# Patient Record
Sex: Female | Born: 1950 | Race: White | Hispanic: No | Smoking: Never smoker
Health system: Southern US, Community
[De-identification: ages and names within clinical notes are randomized; demographics above are authoritative.]

## PROBLEM LIST (undated history)

## (undated) DIAGNOSIS — M1990 Unspecified osteoarthritis, unspecified site: Secondary | ICD-10-CM

## (undated) DIAGNOSIS — I10 Essential (primary) hypertension: Secondary | ICD-10-CM

## (undated) DIAGNOSIS — R0789 Other chest pain: Secondary | ICD-10-CM

## (undated) DIAGNOSIS — E119 Type 2 diabetes mellitus without complications: Secondary | ICD-10-CM

## (undated) DIAGNOSIS — R0600 Dyspnea, unspecified: Secondary | ICD-10-CM

## (undated) DIAGNOSIS — E785 Hyperlipidemia, unspecified: Secondary | ICD-10-CM

## (undated) DIAGNOSIS — R0609 Other forms of dyspnea: Secondary | ICD-10-CM

## (undated) DIAGNOSIS — T7840XA Allergy, unspecified, initial encounter: Secondary | ICD-10-CM

## (undated) DIAGNOSIS — H269 Unspecified cataract: Secondary | ICD-10-CM

## (undated) HISTORY — DX: Other forms of dyspnea: R06.09

## (undated) HISTORY — DX: Other chest pain: R07.89

## (undated) HISTORY — PX: PARTIAL HYSTERECTOMY: SHX80

## (undated) HISTORY — PX: KNEE SURGERY: SHX244

## (undated) HISTORY — DX: Unspecified cataract: H26.9

## (undated) HISTORY — DX: Allergy, unspecified, initial encounter: T78.40XA

## (undated) HISTORY — DX: Type 2 diabetes mellitus without complications: E11.9

## (undated) HISTORY — DX: Hyperlipidemia, unspecified: E78.5

## (undated) HISTORY — PX: CARPAL TUNNEL RELEASE: SHX101

## (undated) HISTORY — PX: OTHER SURGICAL HISTORY: SHX169

## (undated) HISTORY — DX: Unspecified osteoarthritis, unspecified site: M19.90

## (undated) HISTORY — DX: Dyspnea, unspecified: R06.00

## (undated) HISTORY — DX: Essential (primary) hypertension: I10

---

## 1972-07-12 HISTORY — PX: TUBAL LIGATION: SHX77

## 1984-07-12 HISTORY — PX: BACK SURGERY: SHX140

## 1999-05-26 ENCOUNTER — Ambulatory Visit (HOSPITAL_COMMUNITY): Admission: RE | Admit: 1999-05-26 | Discharge: 1999-05-26 | Payer: Self-pay | Admitting: Internal Medicine

## 1999-05-26 ENCOUNTER — Encounter: Payer: Self-pay | Admitting: Internal Medicine

## 2002-07-12 HISTORY — PX: BACK SURGERY: SHX140

## 2002-11-19 ENCOUNTER — Other Ambulatory Visit: Admission: RE | Admit: 2002-11-19 | Discharge: 2002-11-19 | Payer: Self-pay | Admitting: *Deleted

## 2003-01-09 ENCOUNTER — Encounter: Admission: RE | Admit: 2003-01-09 | Discharge: 2003-01-09 | Payer: Self-pay | Admitting: Orthopedic Surgery

## 2003-01-09 ENCOUNTER — Encounter: Payer: Self-pay | Admitting: Orthopedic Surgery

## 2003-02-12 ENCOUNTER — Encounter: Payer: Self-pay | Admitting: Orthopedic Surgery

## 2003-02-18 ENCOUNTER — Inpatient Hospital Stay (HOSPITAL_COMMUNITY): Admission: RE | Admit: 2003-02-18 | Discharge: 2003-02-20 | Payer: Self-pay | Admitting: Orthopedic Surgery

## 2003-02-18 ENCOUNTER — Encounter: Payer: Self-pay | Admitting: Orthopedic Surgery

## 2004-09-18 ENCOUNTER — Encounter: Admission: RE | Admit: 2004-09-18 | Discharge: 2004-09-18 | Payer: Self-pay | Admitting: Internal Medicine

## 2006-10-27 ENCOUNTER — Ambulatory Visit: Payer: Self-pay | Admitting: Gastroenterology

## 2013-01-26 ENCOUNTER — Other Ambulatory Visit: Payer: Self-pay | Admitting: *Deleted

## 2013-01-26 MED ORDER — INSULIN LISPRO 100 UNIT/ML (KWIKPEN): PEN_INJECTOR | SUBCUTANEOUS | Status: DC

## 2013-09-20 ENCOUNTER — Other Ambulatory Visit: Payer: Self-pay | Admitting: Family Medicine

## 2013-09-20 DIAGNOSIS — R0989 Other specified symptoms and signs involving the circulatory and respiratory systems: Secondary | ICD-10-CM

## 2013-09-24 ENCOUNTER — Other Ambulatory Visit: Payer: Self-pay | Admitting: Endocrinology

## 2013-09-25 ENCOUNTER — Ambulatory Visit
Admission: RE | Admit: 2013-09-25 | Discharge: 2013-09-25 | Disposition: A | Discharge status: Home / Self Care | Payer: Disability Insurance | Source: Ambulatory Visit | Attending: Family Medicine | Admitting: Family Medicine

## 2013-09-25 DIAGNOSIS — R0989 Other specified symptoms and signs involving the circulatory and respiratory systems: Secondary | ICD-10-CM

## 2014-04-11 ENCOUNTER — Ambulatory Visit (HOSPITAL_COMMUNITY)
Admission: RE | Admit: 2014-04-11 | Discharge: 2014-04-11 | Disposition: A | Discharge status: Home / Self Care | Payer: Disability Insurance | Source: Ambulatory Visit | Attending: Family Medicine | Admitting: Family Medicine

## 2014-04-11 ENCOUNTER — Other Ambulatory Visit (HOSPITAL_COMMUNITY): Payer: Self-pay | Admitting: Family Medicine

## 2014-04-11 DIAGNOSIS — M25512 Pain in left shoulder: Secondary | ICD-10-CM

## 2014-04-11 DIAGNOSIS — M545 Low back pain, unspecified: Secondary | ICD-10-CM

## 2014-04-11 DIAGNOSIS — I7090 Unspecified atherosclerosis: Secondary | ICD-10-CM | POA: Diagnosis not present

## 2014-04-11 DIAGNOSIS — M542 Cervicalgia: Secondary | ICD-10-CM

## 2014-04-11 DIAGNOSIS — M549 Dorsalgia, unspecified: Secondary | ICD-10-CM | POA: Diagnosis present

## 2014-04-11 DIAGNOSIS — M419 Scoliosis, unspecified: Secondary | ICD-10-CM | POA: Insufficient documentation

## 2016-11-01 DIAGNOSIS — M25511 Pain in right shoulder: Secondary | ICD-10-CM | POA: Diagnosis not present

## 2016-11-11 DIAGNOSIS — M25511 Pain in right shoulder: Secondary | ICD-10-CM | POA: Diagnosis not present

## 2016-11-22 DIAGNOSIS — S46011A Strain of muscle(s) and tendon(s) of the rotator cuff of right shoulder, initial encounter: Secondary | ICD-10-CM | POA: Diagnosis not present

## 2016-11-22 DIAGNOSIS — M25511 Pain in right shoulder: Secondary | ICD-10-CM | POA: Diagnosis not present

## 2016-11-30 DIAGNOSIS — M25511 Pain in right shoulder: Secondary | ICD-10-CM | POA: Diagnosis not present

## 2016-11-30 DIAGNOSIS — E781 Pure hyperglyceridemia: Secondary | ICD-10-CM | POA: Diagnosis not present

## 2016-11-30 DIAGNOSIS — D509 Iron deficiency anemia, unspecified: Secondary | ICD-10-CM | POA: Diagnosis not present

## 2016-11-30 DIAGNOSIS — R0789 Other chest pain: Secondary | ICD-10-CM | POA: Diagnosis not present

## 2016-11-30 DIAGNOSIS — Z7984 Long term (current) use of oral hypoglycemic drugs: Secondary | ICD-10-CM | POA: Diagnosis not present

## 2016-11-30 DIAGNOSIS — Z01818 Encounter for other preprocedural examination: Secondary | ICD-10-CM | POA: Diagnosis not present

## 2016-11-30 DIAGNOSIS — E1165 Type 2 diabetes mellitus with hyperglycemia: Secondary | ICD-10-CM | POA: Diagnosis not present

## 2016-11-30 DIAGNOSIS — I10 Essential (primary) hypertension: Secondary | ICD-10-CM | POA: Diagnosis not present

## 2016-11-30 DIAGNOSIS — R0609 Other forms of dyspnea: Secondary | ICD-10-CM | POA: Diagnosis not present

## 2016-11-30 DIAGNOSIS — E1140 Type 2 diabetes mellitus with diabetic neuropathy, unspecified: Secondary | ICD-10-CM | POA: Diagnosis not present

## 2016-11-30 DIAGNOSIS — Z794 Long term (current) use of insulin: Secondary | ICD-10-CM | POA: Diagnosis not present

## 2016-12-07 ENCOUNTER — Telehealth: Payer: Self-pay | Admitting: Student

## 2016-12-07 NOTE — Telephone Encounter (Signed)
Received records from [facility] for appointment on 12/17/16 with Bernerd Pho, [place].  Records put with Brittany's schedule for 12/17/16. lp

## 2016-12-10 DIAGNOSIS — Z794 Long term (current) use of insulin: Secondary | ICD-10-CM | POA: Diagnosis not present

## 2016-12-10 DIAGNOSIS — E781 Pure hyperglyceridemia: Secondary | ICD-10-CM | POA: Diagnosis not present

## 2016-12-10 DIAGNOSIS — I10 Essential (primary) hypertension: Secondary | ICD-10-CM | POA: Diagnosis not present

## 2016-12-10 DIAGNOSIS — Z7984 Long term (current) use of oral hypoglycemic drugs: Secondary | ICD-10-CM | POA: Diagnosis not present

## 2016-12-10 DIAGNOSIS — D72829 Elevated white blood cell count, unspecified: Secondary | ICD-10-CM | POA: Diagnosis not present

## 2016-12-10 DIAGNOSIS — R0989 Other specified symptoms and signs involving the circulatory and respiratory systems: Secondary | ICD-10-CM | POA: Diagnosis not present

## 2016-12-10 DIAGNOSIS — M25511 Pain in right shoulder: Secondary | ICD-10-CM | POA: Diagnosis not present

## 2016-12-10 DIAGNOSIS — Z01818 Encounter for other preprocedural examination: Secondary | ICD-10-CM | POA: Diagnosis not present

## 2016-12-10 DIAGNOSIS — E1140 Type 2 diabetes mellitus with diabetic neuropathy, unspecified: Secondary | ICD-10-CM | POA: Diagnosis not present

## 2016-12-10 DIAGNOSIS — E1165 Type 2 diabetes mellitus with hyperglycemia: Secondary | ICD-10-CM | POA: Diagnosis not present

## 2016-12-10 DIAGNOSIS — D509 Iron deficiency anemia, unspecified: Secondary | ICD-10-CM | POA: Diagnosis not present

## 2016-12-16 NOTE — Progress Notes (Signed)
Cardiology Office Note    Date:  12/17/2016   ID:  Yesenia Meyers, Yesenia Meyers, Yesenia Meyers, MRN [id number]  PCP:  Leighton Ruff, MD  Cardiologist: New to [facility] - Wishes to follow with Dr. Martinique   Chief Complaint  Patient presents with  . New Patient (Initial Visit)    Cardiac Clearance    History of Present Illness:    Yesenia Meyers is a 66 y.o. female with past medical history of HTN, HLD and Type 2 DM who is being evaluated today for preoperative cardiac clearance at the request of Dr. Leighton Ruff.   She was examined by her PCP on 11/30/2016 for preoperative clearance in regards to right rotator cuff repair surgery. She reported episodes of chest discomfort occurring with emotional stress but usually resolving with deep breathing. Also reported experiencing dyspnea on exertion when carrying out daily activities. EKG showed NSR, HR 84, LAD, and no acute ST or T-wave changes. Cardiac clearance was recommended prior to proceeding with surgery.  In talking with the patient today, she denies any prior cardiac history of known CAD or prior MI's. She does report having a stress test 10+ years ago which was normal. She has HTN, HLD, and Type 2 DM as mentioned above. Has a significant family history of CAD with her mother and father both having MI's in their 55s.  She denies any recent chest discomfort or dyspnea on exertion. She reports getting fatigued when walking around the grocery store due to her significant knee and back pain. She has 4 steps in her home and can climb these without difficulty. Says she has climbed a set of 10 stairs within the past year without any exertional symptoms. She does note occasional episodes of a tightness in her chest which occurs when she is anxious. The symptoms typically resolve within a few seconds and there is no association with exertion.   No recent orthopnea, PND, or lower extremity edema. She denies any palpitations or presyncope.  No prior tobacco  use, alcohol use, or recreational drug use.   Past Medical History:  Diagnosis Date  . Atypical chest pain   . Diabetes mellitus without complication ([place])   . DOE (dyspnea on exertion)   . Hyperlipemia   . Hypertension     History reviewed. No pertinent surgical history.  Current Medications: Outpatient Medications Prior to Visit  Medication Sig Dispense Refill  . acetaminophen (TYLENOL) 500 MG tablet Take 500 mg by mouth every 6 (six) hours as needed.    Marland Kitchen atorvastatin (LIPITOR) 20 MG tablet Take 20 mg by mouth daily.    . Biotin 10000 MCG TABS Take 1,000 Units by mouth daily.    . Insulin Detemir (LEVEMIR FLEXPEN) 100 UNIT/ML Pen Inject 24-30 Units into the skin daily at 10 pm. 24 units in the a.m. And 30 unites in the p.m    . losartan-hydrochlorothiazide (HYZAAR) 100-12.5 MG tablet Take 1 tablet by mouth daily.    . metFORMIN (GLUCOPHAGE) 500 MG tablet Take 500 mg by mouth 2 (two) times daily with a meal.    . Multiple Vitamin (MULTI-VITAMIN DAILY PO) Take 1 tablet by mouth daily.    . Omega-3 Fatty Acids (FISH OIL) 1000 MG CAPS Take 1,000 mg by mouth daily.    . insulin lispro (HUMALOG KWIKPEN) 100 UNIT/ML SOPN Take 14 units before breakfast and lunch and 16 units before supper daily 5 pen 6   No facility-administered medications prior to visit.  Allergies:   Celebrex [celecoxib]; Feldene [piroxicam]; and Voltaren [diclofenac sodium]   Social History   Social History  . Marital status: Divorced    Spouse name: N/A  . Number of children: N/A  . Years of education: N/A   Social History Main Topics  . Smoking status: Never Smoker  . Smokeless tobacco: Never Used  . Alcohol use No  . Drug use: No  . Sexual activity: Not Asked   Other Topics Concern  . None   Social History Narrative  . None     Family History:  The patient's family history includes Diabetes in her father and mother; Heart disease in her father and mother; Hypertension in her father and  mother.   Review of Systems:   Please see the history of present illness.     General:  No chills, fever, night sweats or weight changes. Positive for fatigue.  Cardiovascular:  No chest pain, dyspnea on exertion, edema, orthopnea, palpitations, paroxysmal nocturnal dyspnea. Dermatological: No rash, lesions/masses Respiratory: No cough, dyspnea Urologic: No hematuria, dysuria MSK: Positive for right shoulder pain and knee pain.  Abdominal:   No nausea, vomiting, diarrhea, bright red blood per rectum, melena, or hematemesis Neurologic:  No visual changes, wkns, changes in mental status. All other systems reviewed and are otherwise negative except as noted above.   Physical Exam:    VS:  BP 134/80   Pulse 78   Ht 5' (1.524 m)   Wt 174 lb (78.9 kg)   BMI 33.98 kg/m    General: Well developed, well nourished Caucasian female appearing in no acute distress. Head: Normocephalic, atraumatic, sclera non-icteric, no xanthomas, nares are without discharge.  Neck: No carotid bruits. JVD not elevated.  Lungs: Respirations regular and unlabored, without wheezes or rales.  Heart: Regular rate and rhythm. No S3 or S4.  No murmur, no rubs, or gallops appreciated. Abdomen: Soft, non-tender, non-distended with normoactive bowel sounds. No hepatomegaly. No rebound/guarding. No obvious abdominal masses. Msk:  Strength and tone appear normal for age. No joint deformities or effusions. Extremities: No clubbing or cyanosis. No lower extremity edema.  Distal pedal pulses are 2+ bilaterally. Neuro: Alert and oriented X 3. Moves all extremities spontaneously. No focal deficits noted. Psych:  Responds to questions appropriately with a normal affect. Skin: No rashes or lesions noted  Wt Readings from Last 3 Encounters:  12/17/16 174 lb (78.9 kg)     Studies/Labs Reviewed:   EKG:  EKG is not ordered today. EKG from 11/30/2016 is reviewed which shows NSR, HR 84, with LAD. No acute ST or T-wave changes.    Recent Labs: No results found for requested labs within last 8760 hours.   Lipid Panel No results found for: CHOL, TRIG, HDL, CHOLHDL, VLDL, LDLCALC, LDLDIRECT   Assessment:    1. Preoperative cardiovascular examination   2. Essential hypertension   3. Mixed hyperlipidemia   4. IDDM (insulin dependent diabetes mellitus) ([place])      Plan:   In order of problems listed above:  1. Preoperative Cardiac Clearance - the patient is planning to undergo rotator cuff repair. She has a known history of HTN, HLD and Type 2 DM but denies any known CAD or prior MI's. - she is not overly active at baseline secondary to back and knee pain but denies any exertional chest pain or dyspnea on exertion. Has experienced episodes of chest tightness when under financial stress which improves with deep breathing.  - EKG shows no acute ischemic  changes.  - overall, her procedure itself is low-risk from a cardiac perspective and she denies any recent anginal symptoms. Therefore no further testing is indicated prior to her surgery and she is cleared to proceed. Would recommend an ETT to be obtained after surgery for risk stratification in the setting of her multiple risk-factors but this does not need to be obtained prior to her procedure. Discussed the patient with Dr. Ellyn Hack (DOD) as she is new to our practice and he was in agreement with the above assessment and plan.   2. HTN - BP is well-controlled at 134/80 during today's visit.  - continue Losartan-HCTZ 100-12.5mg  daily.   3. HLD - Lipid Panel in 11/2016 showed total cholesterol of 120, HDL 34, and LDL of 38.  - continue Lipitor 20mg  daily.   4. IDDM - Hgb A1c elevated to 8.8 when checked on 11/30/2016. - followed by PCP.   Medication Adjustments/Labs and Tests Ordered: Current medicines are reviewed at length with the patient today.  Concerns regarding medicines are outlined above.  Medication changes, Labs and Tests ordered today are listed in  the Patient Instructions below. Patient Instructions  Medication Instructions: No changes   Procedures/Testing: Your physician has requested that you have an exercise tolerance test. For further information please visit [email or web address]. Please also follow instruction sheet, as given. This will take place at [place], [address].  Please have this done after you have been cleared by the surgeon.   Follow-Up: Your physician wants you to follow-up in: One year with Dr. Martinique. You will receive a reminder letter in the mail two months in advance. If you don't receive a letter, please call our office to schedule the follow-up appointment.   Signed, Erma Heritage, PA-C  12/17/2016 2:11 PM    [facility] [address], [place], [place]  [address] Phone: [phone]; Fax: [phone]  [address] [place], [place] [address] Phone: [phone]

## 2016-12-17 ENCOUNTER — Ambulatory Visit (INDEPENDENT_AMBULATORY_CARE_PROVIDER_SITE_OTHER): Payer: PPO | Admitting: Student

## 2016-12-17 ENCOUNTER — Encounter: Payer: Self-pay | Admitting: Student

## 2016-12-17 VITALS — BP 134/80 | HR 78 | Ht 60.0 in | Wt 174.0 lb

## 2016-12-17 DIAGNOSIS — E782 Mixed hyperlipidemia: Secondary | ICD-10-CM

## 2016-12-17 DIAGNOSIS — Z794 Long term (current) use of insulin: Secondary | ICD-10-CM

## 2016-12-17 DIAGNOSIS — Z01810 Encounter for preprocedural cardiovascular examination: Secondary | ICD-10-CM

## 2016-12-17 DIAGNOSIS — I10 Essential (primary) hypertension: Secondary | ICD-10-CM

## 2016-12-17 DIAGNOSIS — E119 Type 2 diabetes mellitus without complications: Secondary | ICD-10-CM

## 2016-12-17 DIAGNOSIS — IMO0001 Reserved for inherently not codable concepts without codable children: Secondary | ICD-10-CM

## 2016-12-17 NOTE — Patient Instructions (Addendum)
Medication Instructions: No changes   Procedures/Testing: Your physician has requested that you have an exercise tolerance test. For further information please visit [email or web address]. Please also follow instruction sheet, as given. This will take place at [place], [address].  Please have this done after you have been cleared by the surgeon.   Follow-Up: Your physician wants you to follow-up in: One year with Dr. Martinique. You will receive a reminder letter in the mail two months in advance. If you don't receive a letter, please call our office to schedule the follow-up appointment.   If you need a refill on your cardiac medications before your next appointment, please call your pharmacy.   Exercise Stress Electrocardiogram An exercise stress electrocardiogram is a test that is done to evaluate the blood supply to your heart. This test may also be called exercise stress electrocardiography. The test is done while you are walking on a treadmill. The goal of this test is to raise your heart rate. This test is done to find areas of poor blood flow to the heart by determining the extent of coronary artery disease (CAD). CAD is defined as narrowing in one or more heart (coronary) arteries of more than 70%. If you have an abnormal test result, this may mean that you are not getting adequate blood flow to your heart during exercise. Additional testing may be needed to understand why your test was abnormal. Tell a health care provider about:  Any allergies you have.  All medicines you are taking, including vitamins, herbs, eye drops, creams, and over-the-counter medicines.  Any problems you or family members have had with anesthetic medicines.  Any blood disorders you have.  Any surgeries you have had.  Any medical conditions you have.  Possibility of pregnancy, if this applies. What are the risks? Generally, this is a safe procedure. However, as with any procedure, complications  can occur. Possible complications can include:  Pain or pressure in the following areas: ? Chest. ? Jaw or neck. ? Between your shoulder blades. ? Radiating down your left arm.  Dizziness or light-headedness.  Shortness of breath.  Increased or irregular heartbeats.  Nausea or vomiting.  Heart attack (rare).  What happens before the procedure?  Avoid all forms of caffeine 24 hours before your test or as directed by your health care provider. This includes coffee, tea (even decaffeinated tea), caffeinated sodas, chocolate, cocoa, and certain pain medicines.  Follow your health care provider's instructions regarding eating and drinking before the test.  Take your medicines as directed at regular times with water unless instructed otherwise. Exceptions may include: ? If you have diabetes, ask how you are to take your insulin or pills. It is common to adjust insulin dosing the morning of the test. ? If you are taking beta-blocker medicines, it is important to talk to your health care provider about these medicines well before the date of your test. Taking beta-blocker medicines may interfere with the test. In some cases, these medicines need to be changed or stopped 24 hours or more before the test. ? If you wear a nitroglycerin patch, it may need to be removed prior to the test. Ask your health care provider if the patch should be removed before the test.  If you use an inhaler for any breathing condition, bring it with you to the test.  If you are an outpatient, bring a snack so you can eat right after the stress phase of the test.  Do not smoke  for 4 hours prior to the test or as directed by your health care provider.  Do not apply lotions, powders, creams, or oils on your chest prior to the test.  Wear loose-fitting clothes and comfortable shoes for the test. This test involves walking on a treadmill. What happens during the procedure?  Multiple patches (electrodes) will be  put on your chest. If needed, small areas of your chest may have to be shaved to get better contact with the electrodes. Once the electrodes are attached to your body, multiple wires will be attached to the electrodes and your heart rate will be monitored.  Your heart will be monitored both at rest and while exercising.  You will walk on a treadmill. The treadmill will be started at a slow pace. The treadmill speed and incline will gradually be increased to raise your heart rate. What happens after the procedure?  Your heart rate and blood pressure will be monitored after the test.  You may return to your normal schedule including diet, activities, and medicines, unless your health care provider tells you otherwise. This information is not intended to replace advice given to you by your health care provider. Make sure you discuss any questions you have with your health care provider. Document Released: 06/25/2000 Document Revised: 12/04/2015 Document Reviewed: 03/05/2013 Elsevier Interactive Patient Education  2017 [employer].

## 2016-12-21 ENCOUNTER — Encounter: Payer: Self-pay | Admitting: Hematology

## 2016-12-21 ENCOUNTER — Telehealth: Payer: Self-pay | Admitting: Hematology

## 2016-12-21 NOTE — Telephone Encounter (Signed)
Appt has been scheduled for the pt to see Dr. Irene Limbo on 6/28 at 3pm. Pt agreed to the appt date and time. Demographics verified. Letter mailed to the pt and faxed to the referring.

## 2016-12-22 ENCOUNTER — Telehealth (HOSPITAL_COMMUNITY): Payer: Self-pay | Admitting: Student

## 2016-12-23 NOTE — Telephone Encounter (Signed)
  12/22/2016 10:01 AM Phone (Outgoing) Yesenia Meyers, Yesenia Meyers (Self) [phone] (H)   Left Message - Called pt and lmsg for him to CB to get scheduled for ETT.    By Verdene Rio

## 2016-12-27 ENCOUNTER — Other Ambulatory Visit: Payer: Self-pay | Admitting: *Deleted

## 2016-12-27 ENCOUNTER — Encounter: Payer: Self-pay | Admitting: *Deleted

## 2016-12-27 NOTE — Patient Outreach (Signed)
HTA THN Screen:  Pt sees primary care MD routinely (Dr. Abel Presto patient is scheduled for orthopedic surgery on her shoulder. She had a fall last October which injured it. She takes medications as ordered for chronic problems (DM, HTN, Hyperlipidemia). She admits her diabetes management could be better. Pt No acute health concerns. No care management needs. Introductory letter sent and several diabetes diet Emmi education articles.  I have asked the pt to call me after her surgery should she have any needs.  Yesenia Meyers. Yesenia Neither, MSN, [facility] Gerontological Nurse Practitioner [facility] Care Management [phone]

## 2016-12-29 DIAGNOSIS — D472 Monoclonal gammopathy: Secondary | ICD-10-CM | POA: Diagnosis not present

## 2016-12-29 DIAGNOSIS — D72829 Elevated white blood cell count, unspecified: Secondary | ICD-10-CM | POA: Diagnosis not present

## 2016-12-29 DIAGNOSIS — D72820 Lymphocytosis (symptomatic): Secondary | ICD-10-CM | POA: Diagnosis not present

## 2016-12-29 DIAGNOSIS — D729 Disorder of white blood cells, unspecified: Secondary | ICD-10-CM | POA: Diagnosis not present

## 2017-01-06 ENCOUNTER — Encounter: Payer: Self-pay | Admitting: Hematology

## 2017-01-06 ENCOUNTER — Ambulatory Visit (HOSPITAL_BASED_OUTPATIENT_CLINIC_OR_DEPARTMENT_OTHER): Payer: PPO

## 2017-01-06 ENCOUNTER — Telehealth: Payer: Self-pay | Admitting: Hematology

## 2017-01-06 ENCOUNTER — Ambulatory Visit (HOSPITAL_BASED_OUTPATIENT_CLINIC_OR_DEPARTMENT_OTHER): Payer: PPO | Admitting: Hematology

## 2017-01-06 ENCOUNTER — Other Ambulatory Visit (HOSPITAL_COMMUNITY)
Admission: RE | Admit: 2017-01-06 | Discharge: 2017-01-06 | Disposition: A | Discharge status: Home / Self Care | Payer: PPO | Source: Ambulatory Visit | Attending: Hematology | Admitting: Hematology

## 2017-01-06 VITALS — BP 152/74 | HR 105 | Temp 98.4°F | Resp 18 | Ht 60.0 in | Wt 173.9 lb

## 2017-01-06 DIAGNOSIS — D72820 Lymphocytosis (symptomatic): Secondary | ICD-10-CM | POA: Diagnosis not present

## 2017-01-06 DIAGNOSIS — D729 Disorder of white blood cells, unspecified: Secondary | ICD-10-CM

## 2017-01-06 DIAGNOSIS — D472 Monoclonal gammopathy: Secondary | ICD-10-CM | POA: Diagnosis not present

## 2017-01-06 DIAGNOSIS — D72829 Elevated white blood cell count, unspecified: Secondary | ICD-10-CM

## 2017-01-06 LAB — COMPREHENSIVE METABOLIC PANEL
ALT: 31 U/L (ref 0–55)
AST: 27 U/L (ref 5–34)
Albumin: 4 g/dL (ref 3.5–5.0)
Alkaline Phosphatase: 81 U/L (ref 40–150)
Anion Gap: 11 mEq/L (ref 3–11)
BUN: 13.4 mg/dL (ref 7.0–26.0)
CO2: 28 meq/L (ref 22–29)
Calcium: 10.2 mg/dL (ref 8.4–10.4)
Chloride: 102 mEq/L (ref 98–109)
Creatinine: 1 mg/dL (ref 0.6–1.1)
EGFR: 56 mL/min/{1.73_m2} — ABNORMAL LOW (ref >90)
GLUCOSE: 144 mg/dL — AB (ref 70–140)
POTASSIUM: 3.7 meq/L (ref 3.5–5.1)
SODIUM: 142 meq/L (ref 136–145)
Total Bilirubin: 0.88 mg/dL (ref 0.20–1.20)
Total Protein: 8.2 g/dL (ref 6.4–8.3)

## 2017-01-06 LAB — CBC & DIFF AND RETIC
BASO%: 0.2 % (ref 0.0–2.0)
Basophils Absolute: 0 10*3/uL (ref 0.0–0.1)
EOS%: 1.4 % (ref 0.0–7.0)
Eosinophils Absolute: 0.2 10*3/uL (ref 0.0–0.5)
HCT: 44.6 % (ref 34.8–46.6)
HGB: 15.1 g/dL (ref 11.6–15.9)
Immature Retic Fract: 6.3 % (ref 1.60–10.00)
LYMPH#: 4.1 10*3/uL — AB (ref 0.9–3.3)
LYMPH%: 28.1 % (ref 14.0–49.7)
MCH: 30.7 pg (ref 25.1–34.0)
MCHC: 33.9 g/dL (ref 31.5–36.0)
MCV: 90.7 fL (ref 79.5–101.0)
MONO#: 1.2 10*3/uL — ABNORMAL HIGH (ref 0.1–0.9)
MONO%: 8.4 % (ref 0.0–14.0)
NEUT%: 61.9 % (ref 38.4–76.8)
NEUTROS ABS: 9 10*3/uL — AB (ref 1.5–6.5)
Platelets: 298 10*3/uL (ref 145–400)
RBC: 4.92 10*6/uL (ref 3.70–5.45)
RDW: 12.6 % (ref 11.2–14.5)
RETIC %: 1.45 % (ref 0.70–2.10)
RETIC CT ABS: 71.34 10*3/uL (ref 33.70–90.70)
WBC: 14.6 10*3/uL — AB (ref 3.9–10.3)

## 2017-01-06 LAB — LACTATE DEHYDROGENASE: LDH: 178 U/L (ref 125–245)

## 2017-01-06 NOTE — Patient Instructions (Signed)
Thank you for choosing [facility] to provide your oncology and hematology care.  To afford each patient quality time with our providers, please arrive 30 minutes before your scheduled appointment time.  If you arrive late for your appointment, you may be asked to reschedule.  We strive to give you quality time with our providers, and arriving late affects you and other patients whose appointments are after yours.  If you are a no show for multiple scheduled visits, you may be dismissed from the clinic at the providers discretion.   Again, thank you for choosing [facility], our hope is that these requests will decrease the amount of time that you wait before being seen by our physicians.  ______________________________________________________________________ Should you have questions after your visit to the  Cancer Center, please contact our office at [phone] between the hours of 8:30 and 4:30 p.m.    Voicemails left after 4:30p.m will not be returned until the following business day.   For prescription refill requests, please have your pharmacy contact us directly.  Please also try to allow 48 hours for prescription requests.   Please contact the scheduling department for questions regarding scheduling.  For scheduling of procedures such as PET scans, CT scans, MRI, Ultrasound, etc please contact central scheduling at [phone].   Resources For Cancer Patients and Caregivers:  American Cancer Society:  800-227-2345  Can help patients locate various types of support and financial assistance Cancer Care: 1-800-813-HOPE (4673) Provides financial assistance, online support groups, medication/co-pay assistance.   [place] DSS:  336-641-3447 Where to apply for food stamps, Medicaid, and utility assistance Medicare Rights Center: 800-333-4114 Helps people with Medicare understand their rights and benefits, navigate the Medicare system, and secure the  quality healthcare they deserve SCAT: 336-333-6589 [employer] [employer]'s shared-ride transportation service for eligible riders who have a disability that prevents them from riding the fixed route bus.   For additional information on assistance programs please contact our social worker:   Grier Hock/Abigail Elmore:  [phone] 

## 2017-01-06 NOTE — Progress Notes (Signed)
HEMATOLOGY/ONCOLOGY CONSULTATION NOTE  Date of Service: 01/06/2017  Patient Care Team: Leighton Ruff, MD as PCP - General (Family Medicine)  CHIEF COMPLAINTS/PURPOSE OF CONSULTATION:  Abnormal CBC/leucocytosis.   HISTORY OF PRESENTING ILLNESS:   Yesenia Meyers is a wonderful 66 y.o. female who has been referred to [place] by Dr ..Leighton Ruff, MD  for evaluation of leucocytosis.  Patient has a h/o DM2, IDA, HTN and notes severe rt shoulder pain and decreased ROM since Oct 2017 when she had a fall with trauma to the rt shoulder. She notes that she has been scheduled for surgery soon and is very keen to proceed with surgery.  Her CBC done with her PCP show mild leucocytosis of 12.4-14k from atleast 03/09/2016 with predominantly neutrophilia with ANC 7.7-8.9k. With borderline stable lymphocytosis with lymph # of around 3.8k and borderline monocytosis 1-1.1k.  Her hgb/hct and platelet counts are WNL.  She currently notes rt shoulder pain. No fevers/chills/night sweats/unexplained weight loss/enlarged LN or other overt new focal symptoms.  She notes that she did have a bad dental/gum infection and required antibiotics. No symptoms suggestive of another overt source of infection Denies being on steroids recently.   MEDICAL HISTORY:  Past Medical History:  Diagnosis Date  . Atypical chest pain    Related to anxiety   . Diabetes mellitus without complication ([place])   . DOE (dyspnea on exertion)   . Hyperlipemia   . Hypertension   iron deficiency Anemia  SURGICAL HISTORY: Past Surgical History:  Procedure Laterality Date  . BACK SURGERY  1986   Crushed 2 discs   . BACK SURGERY  2004   Spinal stenosis  . CARPAL TUNNEL RELEASE Bilateral   . KNEE SURGERY Left    66 y.o.  . PARTIAL HYSTERECTOMY N/A    Age 19  . TUBAL LIGATION  1974    SOCIAL HISTORY: Social History   Social History  . Marital status: Divorced    Spouse name: N/A  . Number of children: N/A    . Years of education: N/A   Occupational History  . Not on file.   Social History Main Topics  . Smoking status: Never Smoker  . Smokeless tobacco: Never Used  . Alcohol use No  . Drug use: No  . Sexual activity: Yes   Other Topics Concern  . Not on file   Social History Narrative  . No narrative on file    FAMILY HISTORY: Family History  Problem Relation Age of Onset  . Heart disease Mother        72's  . Diabetes Mother   . Hypertension Mother   . Heart disease Father        60's  . Diabetes Father   . Hypertension Father   . Diabetes Sister   . Diabetes Brother   . Cancer Brother        Colon  . Diabetes Brother     ALLERGIES:  is allergic to celebrex [celecoxib]; feldene [piroxicam]; and voltaren [diclofenac sodium].  MEDICATIONS:  Current Outpatient Prescriptions  Medication Sig Dispense Refill  . acetaminophen (TYLENOL) 500 MG tablet Take 500 mg by mouth every 6 (six) hours as needed (Pt does not take unless severe pain.).     Marland Kitchen atorvastatin (LIPITOR) 20 MG tablet Take 20 mg by mouth daily.    . Biotin 10000 MCG TABS Take 1,000 Units by mouth daily.    . Insulin Detemir (LEVEMIR FLEXPEN) 100 UNIT/ML Pen Inject 24-30 Units  into the skin daily at 10 pm. 24 units in the a.m. And 30 unites in the p.m    . losartan-hydrochlorothiazide (HYZAAR) 100-12.5 MG tablet Take 1 tablet by mouth daily.    . metFORMIN (GLUCOPHAGE) 500 MG tablet Take 500 mg by mouth 2 (two) times daily with a meal.    . Multiple Vitamin (MULTI-VITAMIN DAILY PO) Take 1 tablet by mouth daily.    . Omega-3 Fatty Acids (FISH OIL) 1000 MG CAPS Take 1,000 mg by mouth daily.     No current facility-administered medications for this visit.     REVIEW OF SYSTEMS:    10 Point review of Systems was done is negative except as noted above.  PHYSICAL EXAMINATION: ECOG PERFORMANCE STATUS: 1 - Symptomatic but completely ambulatory  . Vitals:   01/06/17 1456  BP: (!) 152/74  Pulse: (!) 105   Resp: 18  Temp: 98.4 F (36.9 C)   Filed Weights   01/06/17 1456  Weight: 173 lb 14.4 oz (78.9 kg)   .Body mass index is 33.96 kg/m.  GENERAL:alert, in no acute distress and comfortable SKIN: no acute rashes, no significant lesions EYES: conjunctiva are pink and non-injected, sclera anicteric OROPHARYNX: MMM, no exudates, no oropharyngeal erythema or ulceration NECK: supple, no JVD LYMPH:  no palpable lymphadenopathy in the cervical, axillary or inguinal regions LUNGS: clear to auscultation b/l with normal respiratory effort HEART: regular rate & rhythm ABDOMEN:  normoactive bowel sounds , non tender, not distended. No palpable hepato-splenoemgaly. Extremity: no pedal edema PSYCH: alert & oriented x 3 with fluent speech NEURO: no focal motor/sensory deficits  LABORATORY DATA:  I have reviewed the data as listed  . CBC Latest Ref Rng & Units 01/06/2017  WBC 3.9 - 10.3 10e3/uL 14.6(H)  Hemoglobin 11.6 - 15.9 g/dL 15.1  Hematocrit 34.8 - 46.6 % 44.6  Platelets 145 - 400 10e3/uL 298   . CBC    Component Value Date/Time   WBC 14.6 (H) 01/06/2017 1611   RBC 4.92 01/06/2017 1611   HGB 15.1 01/06/2017 1611   HCT 44.6 01/06/2017 1611   PLT 298 01/06/2017 1611   MCV 90.7 01/06/2017 1611   MCH 30.7 01/06/2017 1611   MCHC 33.9 01/06/2017 1611   RDW 12.6 01/06/2017 1611   LYMPHSABS 4.1 (H) 01/06/2017 1611   MONOABS 1.2 (H) 01/06/2017 1611   EOSABS 0.2 01/06/2017 1611   BASOSABS 0.0 01/06/2017 1611   ANC 9k . CMP Latest Ref Rng & Units 01/06/2017 01/06/2017  Glucose 70 - 140 mg/dl 144(H) -  BUN 7.0 - 26.0 mg/dL 13.4 -  Creatinine 0.6 - 1.1 mg/dL 1.0 -  Sodium 136 - 145 mEq/L 142 -  Potassium 3.5 - 5.1 mEq/L 3.7 -  CO2 22 - 29 mEq/L 28 -  Calcium 8.4 - 10.4 mg/dL 10.2 -  Total Protein 6.0 - 8.5 g/dL 8.2 7.8  Total Bilirubin 0.20 - 1.20 mg/dL 0.88 -  Alkaline Phos 40 - 150 U/L 81 -  AST 5 - 34 U/L 27 -  ALT 0 - 55 U/L 31 -   Component     Latest Ref Rng & Units  01/06/2017  LDH     125 - 245 U/L 178  Sed Rate     0 - 40 mm/hr 8        RADIOGRAPHIC STUDIES: I have personally reviewed the radiological images as listed and agreed with the findings in the report. No results found.  ASSESSMENT & PLAN:   66 yo  with incidentally noted leucocytosis on labs  1) Chronic persistent Neutrophilia  Present since at least 02/2016. No significant blasts or left shift on peripheral blood smear.  LDH WNL Sed rate WNL Appears likely reactive ? Uncontrolled rt shoulder pain. R/o inflammation in rt shoulder. Did have issues with dental /periodontal infection in march 2018. No other overt source of infection No palpable hepato-splenomegaly or LNadenopathy  2) Lymphocytosis - non clonal on flow cytometry 3) Mild monocytosis.  4) Newly noted monoclonal IgA Kappa Only small amount of M spike of 0.4g/dl with normal LDH , sed rate and no associated overt CRAB criteria. Likely MGUS.  PLAN -no overt hematologic contraindication to proceed with planned Rt shoulder surgery -workup done for leucocytosis as highlighted/detailed above . -less likely Myelo or lymphoproliferative process -whole body skeletal survey in 2-3 weeks -will hold off on sending clonal molecular markers for MPN at this time unless significant increase in WBC counts. Might improve after shoulder surgery if significant inflammation/infection controlled in this area. 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 -continue f/u with PCP for age appropriate cancer screening.  . Orders Placed This Encounter  Procedures  . CBC & Diff and Retic    Standing Status:   Future    Number of Occurrences:   1    Standing Expiration Date:   01/06/2018  . Comprehensive metabolic panel    Standing Status:   Future    Number of Occurrences:   1    Standing Expiration Date:   01/06/2018  . Lactate dehydrogenase    Standing Status:   Future    Number of Occurrences:   1    Standing  Expiration Date:   01/06/2018  . Sedimentation rate    Standing Status:   Future    Number of Occurrences:   1    Standing Expiration Date:   01/06/2018  . Flow Cytometry    Lymphocytosis r/o clonal process    Standing Status:   Future    Number of Occurrences:   1    Standing Expiration Date:   01/06/2018  . Multiple Myeloma Panel (SPEP&IFE w/QIG)    Standing Status:   Future    Number of Occurrences:   1    Standing Expiration Date:   01/06/2018  . Kappa/lambda light chains    Standing Status:   Future    Number of Occurrences:   1    Standing Expiration Date:   02/10/2018    Labs today RTC with Dr Irene Limbo in 4 weeks with skeletal survey  All of the patients questions were answered with apparent satisfaction. The patient knows to call the clinic with any problems, questions or concerns.  I spent 40 minutes counseling the patient face to face. The total time spent in the appointment was 60 minutes and more than 50% was on counseling and direct patient cares.    Sullivan Lone MD [place] AAHIVMS [facility] Hematology/Oncology Physician [facility]  (Office):       [phone] (Work cell):  [phone] (Fax):           [phone]  01/06/2017 3:32 PM

## 2017-01-06 NOTE — Telephone Encounter (Signed)
Scheduled appt per 6/28 los - Gave patient AVS  And calender per los.

## 2017-01-07 DIAGNOSIS — D72829 Elevated white blood cell count, unspecified: Secondary | ICD-10-CM | POA: Diagnosis not present

## 2017-01-07 LAB — KAPPA/LAMBDA LIGHT CHAINS
IG KAPPA FREE LIGHT CHAIN: 48.1 mg/L — AB (ref 3.3–19.4)
Ig Lambda Free Light Chain: 19.1 mg/L (ref 5.7–26.3)
Kappa/Lambda FluidC Ratio: 2.52 — ABNORMAL HIGH (ref 0.26–1.65)

## 2017-01-07 LAB — SEDIMENTATION RATE: SED RATE: 8 mm/h (ref 0–40)

## 2017-01-10 LAB — MULTIPLE MYELOMA PANEL, SERUM
Albumin SerPl Elph-Mcnc: 4 g/dL (ref 2.9–4.4)
Albumin/Glob SerPl: 1.1 (ref 0.7–1.7)
Alpha 1: 0.3 g/dL (ref 0.0–0.4)
Alpha2 Glob SerPl Elph-Mcnc: 1 g/dL (ref 0.4–1.0)
B-Globulin SerPl Elph-Mcnc: 1.7 g/dL — ABNORMAL HIGH (ref 0.7–1.3)
Gamma Glob SerPl Elph-Mcnc: 0.8 g/dL (ref 0.4–1.8)
Globulin, Total: 3.8 g/dL (ref 2.2–3.9)
IgA, Qn, Serum: 739 mg/dL — ABNORMAL HIGH (ref 87–352)
IgG, Qn, Serum: 757 mg/dL (ref 700–1600)
IgM, Qn, Serum: 118 mg/dL (ref 26–217)
M Protein SerPl Elph-Mcnc: 0.4 g/dL — ABNORMAL HIGH
Total Protein: 7.8 g/dL (ref 6.0–8.5)

## 2017-01-10 LAB — FLOW CYTOMETRY

## 2017-01-11 DIAGNOSIS — M75111 Incomplete rotator cuff tear or rupture of right shoulder, not specified as traumatic: Secondary | ICD-10-CM | POA: Diagnosis not present

## 2017-01-11 DIAGNOSIS — G8918 Other acute postprocedural pain: Secondary | ICD-10-CM | POA: Diagnosis not present

## 2017-01-11 DIAGNOSIS — M7541 Impingement syndrome of right shoulder: Secondary | ICD-10-CM | POA: Diagnosis not present

## 2017-01-11 DIAGNOSIS — S43421A Sprain of right rotator cuff capsule, initial encounter: Secondary | ICD-10-CM | POA: Diagnosis not present

## 2017-01-18 DIAGNOSIS — Z4789 Encounter for other orthopedic aftercare: Secondary | ICD-10-CM | POA: Diagnosis not present

## 2017-01-18 DIAGNOSIS — S46011S Strain of muscle(s) and tendon(s) of the rotator cuff of right shoulder, sequela: Secondary | ICD-10-CM | POA: Diagnosis not present

## 2017-01-20 ENCOUNTER — Telehealth: Payer: Self-pay

## 2017-01-20 NOTE — Telephone Encounter (Signed)
Patient cleared for surgery per Dr. Julien Nordmann during Dr. Grier Mitts vacation. Recommendation to consult with PCP for cardiac and respiratory clearance. Confirmed fax receipt.

## 2017-01-28 DIAGNOSIS — Z4789 Encounter for other orthopedic aftercare: Secondary | ICD-10-CM | POA: Diagnosis not present

## 2017-01-28 DIAGNOSIS — S46011S Strain of muscle(s) and tendon(s) of the rotator cuff of right shoulder, sequela: Secondary | ICD-10-CM | POA: Diagnosis not present

## 2017-02-10 ENCOUNTER — Telehealth: Payer: Self-pay | Admitting: *Deleted

## 2017-02-10 NOTE — Telephone Encounter (Signed)
FYI "Call received earlier about an appointment Monday.  Also spoke with someone about a test or procedure to be done?  What is Monday's appointment about?"    Provider F/U only is scheduled 02-14-2017.  Arrive at 9:10am for 9:40 visit with provider.  Note order for bone survey not authorized or scheduled.  Suggested she discuss this with F/U visit.  No further questions.    "I'll come in.  Just getting over rotator cuff surgery, on pain medicines so my brain just needed to understand what's going on."

## 2017-02-14 ENCOUNTER — Telehealth: Payer: Self-pay | Admitting: Hematology

## 2017-02-14 ENCOUNTER — Encounter: Payer: Self-pay | Admitting: Hematology

## 2017-02-14 ENCOUNTER — Ambulatory Visit (HOSPITAL_BASED_OUTPATIENT_CLINIC_OR_DEPARTMENT_OTHER): Payer: PPO | Admitting: Hematology

## 2017-02-14 VITALS — BP 151/66 | HR 93 | Temp 98.7°F | Resp 18 | Ht 60.0 in | Wt 170.6 lb

## 2017-02-14 DIAGNOSIS — D472 Monoclonal gammopathy: Secondary | ICD-10-CM | POA: Diagnosis not present

## 2017-02-14 DIAGNOSIS — D72829 Elevated white blood cell count, unspecified: Secondary | ICD-10-CM | POA: Diagnosis not present

## 2017-02-14 DIAGNOSIS — D729 Disorder of white blood cells, unspecified: Secondary | ICD-10-CM

## 2017-02-14 NOTE — Telephone Encounter (Signed)
Scheduled appt per 8/6 los - Gave patient AVS and calender per los. - [facility] to contact patient with scan

## 2017-02-14 NOTE — Patient Instructions (Signed)
Thank you for choosing [facility] to provide your oncology and hematology care.  To afford each patient quality time with our providers, please arrive 30 minutes before your scheduled appointment time.  If you arrive late for your appointment, you may be asked to reschedule.  We strive to give you quality time with our providers, and arriving late affects you and other patients whose appointments are after yours.   If you are a no show for multiple scheduled visits, you may be dismissed from the clinic at the providers discretion.    Again, thank you for choosing [facility], our hope is that these requests will decrease the amount of time that you wait before being seen by our physicians.  ______________________________________________________________________  Should you have questions after your visit to the [facility], please contact our office at [phone] between the hours of 8:30 and 4:30 p.m.    Voicemails left after 4:30p.m will not be returned until the following business day.    For prescription refill requests, please have your pharmacy contact us directly.  Please also try to allow 48 hours for prescription requests.    Please contact the scheduling department for questions regarding scheduling.  For scheduling of procedures such as PET scans, CT scans, MRI, Ultrasound, etc please contact central scheduling at [phone].    Resources For Cancer Patients and Caregivers:   Oncolink.org:  A wonderful resource for patients and healthcare providers for information regarding your disease, ways to tract your treatment, what to expect, etc.     American Cancer Society:  800-227-2345  Can help patients locate various types of support and financial assistance  Cancer Care: 1-800-813-HOPE (4673) Provides financial assistance, online support groups, medication/co-pay assistance.    [place] DSS:  336-641-3447 Where to apply for food  stamps, Medicaid, and utility assistance  Medicare Rights Center: 800-333-4114 Helps people with Medicare understand their rights and benefits, navigate the Medicare system, and secure the quality healthcare they deserve  SCAT: 336-333-6589 [place] [employer]'s shared-ride transportation service for eligible riders who have a disability that prevents them from riding the fixed route bus.    For additional information on assistance programs please contact our social worker:   Grier Hock/Abigail Elmore:  [phone]            

## 2017-02-14 NOTE — Progress Notes (Signed)
HEMATOLOGY/ONCOLOGY CLINIC NOTE  Date of Service: 02/14/2017  Patient Care Team: Leighton Ruff, MD as PCP - General (Family Medicine)  CHIEF COMPLAINTS/PURPOSE OF CONSULTATION:  Leucocytosis MGUS   HISTORY OF PRESENTING ILLNESS:   Yesenia Meyers is a wonderful 66 y.o. female who has been referred to [place] by Dr ..Leighton Ruff, MD  for evaluation of leucocytosis.  Patient has a h/o DM2, IDA, HTN and notes severe rt shoulder pain and decreased ROM since Oct 2017 when she had a fall with trauma to the rt shoulder. She notes that she has been scheduled for surgery soon and is very keen to proceed with surgery.  Her CBC done with her PCP show mild leucocytosis of 12.4-14k from atleast 03/09/2016 with predominantly neutrophilia with ANC 7.7-8.9k. With borderline stable lymphocytosis with lymph # of around 3.8k and borderline monocytosis 1-1.1k.  Her hgb/hct and platelet counts are WNL.  She currently notes rt shoulder pain. No fevers/chills/night sweats/unexplained weight loss/enlarged LN or other overt new focal symptoms.  She notes that she did have a bad dental/gum infection and required antibiotics. No symptoms suggestive of another overt source of infection Denies being on steroids recently.  INTERVAL HISTORY  Patient is here for follow-up of her leukocytosis and MGUS to discuss lab results. She had previously refused whole body skeletal survey but after discussion of findings today is willing to have this done prior to her next clinic follow-up. We discussed all her labs in detail and all her questions were answered to her apparent satisfaction. She is eating well from her right shoulder surgery and is undergoing physical therapy but still needs to use a sling at this time.  MEDICAL HISTORY:  Past Medical History:  Diagnosis Date  . Atypical chest pain    Related to anxiety   . Diabetes mellitus without complication ([place])   . DOE (dyspnea on exertion)   .  Hyperlipemia   . Hypertension   iron deficiency Anemia  SURGICAL HISTORY: Past Surgical History:  Procedure Laterality Date  . BACK SURGERY  1986   Crushed 2 discs   . BACK SURGERY  2004   Spinal stenosis  . CARPAL TUNNEL RELEASE Bilateral   . KNEE SURGERY Left    66 y.o.  . PARTIAL HYSTERECTOMY N/A    Age 62  . TUBAL LIGATION  1974    SOCIAL HISTORY: Social History   Social History  . Marital status: Divorced    Spouse name: N/A  . Number of children: N/A  . Years of education: N/A   Occupational History  . Not on file.   Social History Main Topics  . Smoking status: Never Smoker  . Smokeless tobacco: Never Used  . Alcohol use No  . Drug use: No  . Sexual activity: Yes   Other Topics Concern  . Not on file   Social History Narrative  . No narrative on file    FAMILY HISTORY: Family History  Problem Relation Age of Onset  . Heart disease Mother        74's  . Diabetes Mother   . Hypertension Mother   . Heart disease Father        53's  . Diabetes Father   . Hypertension Father   . Diabetes Sister   . Diabetes Brother   . Cancer Brother        Colon  . Diabetes Brother     ALLERGIES:  is allergic to celebrex [celecoxib]; feldene [piroxicam]; and  voltaren [diclofenac sodium].  MEDICATIONS:  Current Outpatient Prescriptions  Medication Sig Dispense Refill  . acetaminophen (TYLENOL) 500 MG tablet Take 500 mg by mouth every 6 (six) hours as needed (Pt does not take unless severe pain.).     Marland Kitchen atorvastatin (LIPITOR) 20 MG tablet Take 20 mg by mouth daily.    . Biotin 10000 MCG TABS Take 1,000 Units by mouth daily.    . Insulin Detemir (LEVEMIR FLEXPEN) 100 UNIT/ML Pen Inject 24-30 Units into the skin daily at 10 pm. 24 units in the a.m. And 30 unites in the p.m    . losartan-hydrochlorothiazide (HYZAAR) 100-12.5 MG tablet Take 1 tablet by mouth daily.    . metFORMIN (GLUCOPHAGE) 500 MG tablet Take 500 mg by mouth 2 (two) times daily with a meal.      . Multiple Vitamin (MULTI-VITAMIN DAILY PO) Take 1 tablet by mouth daily.    . Omega-3 Fatty Acids (FISH OIL) 1000 MG CAPS Take 1,000 mg by mouth daily.     No current facility-administered medications for this visit.     REVIEW OF SYSTEMS:    10 Point review of Systems was done is negative except as noted above.  PHYSICAL EXAMINATION: ECOG PERFORMANCE STATUS: 1 - Symptomatic but completely ambulatory  . Vitals:   02/14/17 1010  BP: (!) 151/66  Pulse: 93  Resp: 18  Temp: 98.7 F (37.1 C)   Filed Weights   02/14/17 1010  Weight: 170 lb 9.6 oz (77.4 kg)   .Body mass index is 33.32 kg/m.  GENERAL:alert, in no acute distress and comfortable SKIN: no acute rashes, no significant lesions EYES: conjunctiva are pink and non-injected, sclera anicteric OROPHARYNX: MMM, no exudates, no oropharyngeal erythema or ulceration NECK: supple, no JVD LYMPH:  no palpable lymphadenopathy in the cervical, axillary or inguinal regions LUNGS: clear to auscultation b/l with normal respiratory effort HEART: regular rate & rhythm ABDOMEN:  normoactive bowel sounds , non tender, not distended. No palpable hepato-splenoemgaly. Extremity: no pedal edema PSYCH: alert & oriented x 3 with fluent speech NEURO: no focal motor/sensory deficits  LABORATORY DATA:  I have reviewed the data as listed  . CBC Latest Ref Rng & Units 01/06/2017  WBC 3.9 - 10.3 10e3/uL 14.6(H)  Hemoglobin 11.6 - 15.9 g/dL 15.1  Hematocrit 34.8 - 46.6 % 44.6  Platelets 145 - 400 10e3/uL 298   . CBC    Component Value Date/Time   WBC 14.6 (H) 01/06/2017 1611   RBC 4.92 01/06/2017 1611   HGB 15.1 01/06/2017 1611   HCT 44.6 01/06/2017 1611   PLT 298 01/06/2017 1611   MCV 90.7 01/06/2017 1611   MCH 30.7 01/06/2017 1611   MCHC 33.9 01/06/2017 1611   RDW 12.6 01/06/2017 1611   LYMPHSABS 4.1 (H) 01/06/2017 1611   MONOABS 1.2 (H) 01/06/2017 1611   EOSABS 0.2 01/06/2017 1611   BASOSABS 0.0 01/06/2017 1611   ANC  9k . CMP Latest Ref Rng & Units 01/06/2017 01/06/2017  Glucose 70 - 140 mg/dl 144(H) -  BUN 7.0 - 26.0 mg/dL 13.4 -  Creatinine 0.6 - 1.1 mg/dL 1.0 -  Sodium 136 - 145 mEq/L 142 -  Potassium 3.5 - 5.1 mEq/L 3.7 -  CO2 22 - 29 mEq/L 28 -  Calcium 8.4 - 10.4 mg/dL 10.2 -  Total Protein 6.0 - 8.5 g/dL 8.2 7.8  Total Bilirubin 0.20 - 1.20 mg/dL 0.88 -  Alkaline Phos 40 - 150 U/L 81 -  AST 5 - 34 U/L  27 -  ALT 0 - 55 U/L 31 -   Component     Latest Ref Rng & Units 01/06/2017  LDH     125 - 245 U/L 178  Sed Rate     0 - 40 mm/hr 8         RADIOGRAPHIC STUDIES: I have personally reviewed the radiological images as listed and agreed with the findings in the report. No results found.  ASSESSMENT & PLAN:   66 yo with incidentally noted leucocytosis on labs  1) Chronic persistent Neutrophilia  Present since at least 02/2016. No significant blasts or left shift on peripheral blood smear.  LDH WNL Sed rate WNL Appears likely reactive ? Uncontrolled rt shoulder pain. R/o inflammation in rt shoulder. Did have issues with dental /periodontal infection in march 2018. No other overt source of infection No palpable hepato-splenomegaly or LNadenopathy to suggest lymphoproliferative or myeloproliferative process.  2) Lymphocytosis - non clonal on flow cytometry 3) Mild monocytosis.  4) Newly noted monoclonal IgA Kappa Only small amount of M spike of 0.4g/dl with normal LDH , sed rate and no associated overt CRAB criteria. Likely MGUS.  PLAN -Lab results and workup as noted above were discussed in details with the patient. -whole body skeletal survey has not been done and will be rescheduled in about 12-16 weeks about a week prior to her clinic follow-up in 4 months. -We will monitor her labs with repeat in 4 months including follow-up for her IgA kappa monoclonal paraproteinemia . -will hold off on BM Bx at this time. -continue f/u with PCP for age appropriate cancer  screening.  . Orders Placed This Encounter  Procedures  . DG Bone Survey Met    Standing Status:   Future    Standing Expiration Date:   04/16/2018    Order Specific Question:   Reason for Exam (SYMPTOM  OR DIAGNOSIS REQUIRED)    Answer:   evaluation of monoclonal paraproteinemia    Order Specific Question:   Preferred imaging location?    Answer:   [facility]    Order Specific Question:   Radiology Contrast Protocol - do NOT remove file path    Answer:   [email or web address]  . CBC & Diff and Retic    Standing Status:   Future    Standing Expiration Date:   02/14/2018  . Comprehensive metabolic panel    Standing Status:   Future    Standing Expiration Date:   02/14/2018  . Multiple Myeloma Panel (SPEP&IFE w/QIG)    Standing Status:   Future    Standing Expiration Date:   02/14/2018  . Kappa/lambda light chains    Standing Status:   Future    Standing Expiration Date:   03/21/2018    Return to clinic with Dr. Irene Limbo in 4 months Whole body skeletal survey one to 2 weeks prior to clinic follow-up Labs 1 week prior to clinic follow-up.  All of the patients questions were answered with apparent satisfaction. The patient knows to call the clinic with any problems, questions or concerns.  I spent 20 minutes counseling the patient face to face. The total time spent in the appointment was 25 minutes and more than 50% was on counseling and direct patient cares.    Sullivan Lone MD [place] AAHIVMS [facility] Hematology/Oncology Physician [facility]  (Office):       [phone] (Work cell):  [phone] (Fax):           [phone]  02/14/2017 10:34 AM

## 2017-03-02 DIAGNOSIS — M25511 Pain in right shoulder: Secondary | ICD-10-CM | POA: Diagnosis not present

## 2017-04-07 ENCOUNTER — Telehealth (HOSPITAL_COMMUNITY): Payer: Self-pay | Admitting: Student

## 2017-04-07 NOTE — Telephone Encounter (Signed)
User: Cherie Dark A Date/time: 03/30/2017 2:45 PM  Comment: Called pt and lmsg for her to CB to sch ETT.Vassie Moment  Context: [place] Schedule Orders/Appt Requests Outcome: Left Message  Phone number: [phone] Phone Type: Home Phone  Comm. type: Telephone Call type: Outgoing  Contact: Charlette Caffey Relation to patient: Self  Letter:      User: Cherie Dark A Date/time: 12/22/2016 10:04 AM  Comment: Pt called back and stated that she would not be having this done until about Sept after she heals from shoulder surgery.   Context: [place] Schedule Orders/Appt Requests Outcome: Completed  Phone number: [phone] Phone Type: Home Phone  Comm. type: Telephone Call type: Incoming  Contact: Charlette Caffey Relation to patient: Self  Letter:      User: Cherie Dark A Date/time: 12/22/2016 10:01 AM  Comment: Called pt and lmsg for her to CB to get scheduled for myoview  Context: [place] Schedule Orders/Appt Requests Outcome: Left Message  Phone number: [phone] Phone Type: Home Phone  Comm. type: Telephone Call type: Outgoing  Contact: Charlette Caffey Relation to patient: Self  Letter:       She will be removed from the [facility]

## 2017-06-06 ENCOUNTER — Ambulatory Visit (HOSPITAL_COMMUNITY): Payer: PPO

## 2017-06-06 ENCOUNTER — Other Ambulatory Visit: Payer: PPO

## 2017-06-09 ENCOUNTER — Telehealth: Payer: Self-pay | Admitting: Hematology

## 2017-06-09 NOTE — Telephone Encounter (Signed)
[place] PAL - moved 12/3 appointments to 12/12. Left message for patient. Schedule mailed.

## 2017-06-13 ENCOUNTER — Ambulatory Visit: Payer: PPO | Admitting: Hematology

## 2017-06-22 ENCOUNTER — Ambulatory Visit: Payer: PPO | Admitting: Hematology

## 2017-06-25 ENCOUNTER — Telehealth: Payer: Self-pay | Admitting: Hematology

## 2017-06-25 NOTE — Telephone Encounter (Signed)
R/s appt per 12/12 sch message - sent reminder letter in the mail with appt date and time.

## 2017-07-25 ENCOUNTER — Ambulatory Visit: Payer: PPO | Admitting: Hematology

## 2017-12-01 DIAGNOSIS — E1165 Type 2 diabetes mellitus with hyperglycemia: Secondary | ICD-10-CM | POA: Diagnosis not present

## 2017-12-01 DIAGNOSIS — R7989 Other specified abnormal findings of blood chemistry: Secondary | ICD-10-CM | POA: Diagnosis not present

## 2017-12-01 DIAGNOSIS — E781 Pure hyperglyceridemia: Secondary | ICD-10-CM | POA: Diagnosis not present

## 2017-12-01 DIAGNOSIS — I10 Essential (primary) hypertension: Secondary | ICD-10-CM | POA: Diagnosis not present

## 2017-12-01 DIAGNOSIS — R0989 Other specified symptoms and signs involving the circulatory and respiratory systems: Secondary | ICD-10-CM | POA: Diagnosis not present

## 2017-12-01 DIAGNOSIS — Z7984 Long term (current) use of oral hypoglycemic drugs: Secondary | ICD-10-CM | POA: Diagnosis not present

## 2017-12-01 DIAGNOSIS — E1140 Type 2 diabetes mellitus with diabetic neuropathy, unspecified: Secondary | ICD-10-CM | POA: Diagnosis not present

## 2018-02-28 ENCOUNTER — Other Ambulatory Visit: Payer: Self-pay

## 2018-02-28 NOTE — Patient Outreach (Signed)
[facility]) Care Management  02/28/2018  Yesenia Meyers 01-Oct-1950 [id number]   Telephone Screen  Referral Date: 02/28/18 Referral Source: HTA Concierge Referral Reason: " member is in coverage gap and in need of financial assistance with her meds" Insurance: HTA   Outreach attempt # 1 to patient. Spoke with patient and screening completed.   Social: Patient resides in her home alone. She voices that she is independent with ADLs/IADLs. She drives herself to her appts. She reports one fall within the last year in Jan. She states she was outside and bent over the wrong way while gardening and accidentally fell. She has a cane that she uses as needed.   Conditions: Per chart review, patient has DM,HTN, HLD and anemia. Patient voices that she is able to manage her chronic conditions on her own. She is checking cbgs every morning and keeps a log of readings. She reports that blood sugars normally range in the low 100's. Blood sugar this morning was 112. Last A1C on file was 8.8(May 2018). She denies needing any assistance or further education or support.    Medications: Patient voices that she is taking four prescriptions meds and four OTC meds. She states that she got her Levemir filled yesterday and was told by pharmacy that this was going to push her into the coverage gap. She voices that for some reason MD wrote quantity of 30 instead of her usual 15 and feels that this is what triggered her to get into gap. She talked with pharmacist regarding this and was able to pick up normal supply of 15 but has checked with HTA and was told she will be in the coverage gap next month. She voices that she trying to be proactive to get assistance. She has checked with PCP office and they do not have samples. Patient denies any issues managing meds.    Appointments: She sees PCP regularly.    Consent: [facility] services reviewed and discussed. Patient gave verbal consent for services and  feels she only needs pharmacy assistance at this time.    Plan: RN CM will send [facility] pharmacy referral for possible med assistance.    Enzo Montgomery, RN,BSN,CCM [place] Management Telephonic Care Management Coordinator Direct Phone: [phone] Toll Free: [phone] Fax: [phone]

## 2018-03-01 ENCOUNTER — Other Ambulatory Visit: Payer: Self-pay | Admitting: Pharmacist

## 2018-03-01 ENCOUNTER — Ambulatory Visit: Payer: Self-pay | Admitting: Pharmacist

## 2018-03-01 NOTE — Patient Outreach (Addendum)
[facility] [facility]) Care Management  03/01/2018  BRANTLEE HINDE 09/03/1950 [id number]  67 yof referred to [place] via [place] for medication assistance.  PMH significant for DMT2, HLD, HTN.  SUBJECTIVE: Successful call to Ms. Henly with HIPAA identifiers verified. Patient states she is about to enter coverage gap any day now with Medicare and needs assistance with her Levemir Flexpen.  She states she is currently paying $45/month for 55m (5 pens), however the next fill will be in the coverage gap.  Patient states she has many bills in addition to her medications.  She verbalizes understanding on insulin and diabetes.  She reports "her diabetes could be better controlled". Patient sees PCP for DM DX (Leighton Ruff.  OBJECTIVE: Medications Reviewed Today    Reviewed by [facility](Pharmac[facility]) on 03/01/18 at 1LeonardList Status: <None>  Medication Order Taking? Sig Documenting Provider Last Dose Status Informant  acetaminophen (TYLENOL) 500 MG tablet [id number] Take 500 mg by mouth every 6 (six) hours as needed (Pt does not take unless severe pain.).  [provider] Taking Active Self  atorvastatin (LIPITOR) 20 MG tablet [id number] Take 20 mg by mouth daily. [provider] Taking Active   Biotin 10000 MCG TABS [id number] Take 1,000 Units by mouth daily. [provider] Taking Active   Insulin Detemir (LEVEMIR FLEXPEN) 100 UNIT/ML Pen [id number] Inject into the skin See admin instructions. 24 units in the a.m. And 30 unites in the p.m  [provider] Taking Active   losartan-hydrochlorothiazide (HYZAAR) 100-12.5 MG tablet [id number] Take 1 tablet by mouth daily. [provider] Taking Active   metFORMIN (GLUCOPHAGE) 500 MG tablet [id number] Take 500 mg by mouth 2 (two) times daily with a meal. [provider] Taking Active   Multiple Vitamin (MULTI-VITAMIN DAILY PO) [id number] Take 1  tablet by mouth daily. [provider] Taking Active   Omega-3 Fatty Acids (FISH OIL) 1000 MG CAPS [id number] Take 1,000 mg by mouth daily. [provider] Taking Active          Drugs sorted by system:  Cardiovascular: atorvastatin, losartan/HCTZ, omega-3 fatty acids  Endocrine: Levemir flexpen, metformin  Pain: APAP PRN  Vitamins/Minerals: MVI, biotin  Gaps in therapy: Consider aspirin '81mg'$  daily given PMH of DMT2 and CVD risk    Assessment: Medication assistance:  Patient unable to afford the following medications  1.Levemir FlexPen  Assistance programs reviewed with patient.  -Extra Help:patient does not qualify for extra help based on reported income -PAP:       -[employer] manufacturers Levemir FlexPen.  Patient meets income requirements based on reported income/pension, however unsure if TROOP $1000 has been met.  I reviewed what is needed in the PAP application when [facility] Patient verbalizes understanding   Plan: -I will route PAP letter to [place] City [place], AEtter Sjogrenwho will assist with contacting provider (Dr. ELeighton Ruff & patient to obtain necessary application requirements -I will f/u with HTA for TROOP -I will follow up with patient in 1-2 weeks to ensure PAP applications have arrived  JRegina Eck PharmD, [place] [phone]

## 2018-03-02 ENCOUNTER — Other Ambulatory Visit: Payer: Self-pay | Admitting: Pharmacy Technician

## 2018-03-02 NOTE — Patient Outreach (Signed)
[place] [facility]) Care Management  03/03/2018  Yesenia Meyers [birth year] [id number]   Received Novo Nordisk patient assistance referral from [place] for [employer]. Prepared patient portion to be mailed and faxed provider portion to Dr. Dwyane Dee.  Will follow up with patient in 5-7 business days to confirm application have been received.  [name] [place], [place] Management [phone]

## 2018-03-10 ENCOUNTER — Ambulatory Visit: Payer: Self-pay | Admitting: Pharmacist

## 2018-03-16 ENCOUNTER — Ambulatory Visit: Payer: Self-pay | Admitting: Pharmacist

## 2018-03-23 ENCOUNTER — Ambulatory Visit: Payer: Self-pay | Admitting: Pharmacist

## 2018-03-28 ENCOUNTER — Ambulatory Visit: Payer: Self-pay | Admitting: Pharmacist

## 2018-03-31 ENCOUNTER — Other Ambulatory Visit: Payer: Self-pay | Admitting: Pharmacist

## 2018-03-31 ENCOUNTER — Ambulatory Visit: Payer: Self-pay | Admitting: Pharmacist

## 2018-03-31 NOTE — Patient Outreach (Addendum)
[place] [facility]) Care Management  03/31/2018  Yesenia Meyers Nov 22, 1950 [id number]   Successful outreach call to [name] regarding medication assistance/PAP status.  HIPAA identifiers verified.  Patient states she has not mailed back her packet due to her denial last year.  After sorting through papers, she realized she had applied last year and didn't get approve because she has "private insurance".  Patient states she is currently in the coverage gap and patient paid $127 for one month supply of insulin.  Encouraged patient to fill out PAP and mail back.  [facility] Pharmacist reminded patient that there is assistance while in the coverage gap for medicare patients.  She states she will think about it and let me know.  Incoming call from [place], Etter Sjogren to inform me to tell patient not to select that she has private or commercial insurance on the PAP form.  Will instruct patient.  PLAN: -Will alert [facility] CPhT Etter Sjogren of today's findings -Will follow up with patient within 4 business days.  Will plan to sign off if patient remains un-engaged in PAP process.

## 2018-04-04 ENCOUNTER — Ambulatory Visit: Payer: Self-pay | Admitting: Pharmacist

## 2018-04-04 ENCOUNTER — Other Ambulatory Visit: Payer: Self-pay | Admitting: Pharmacist

## 2018-04-04 NOTE — Patient Outreach (Signed)
[place] [facility]) Care Management  04/04/2018  JIMMI SIDENER [birth year] [id number]  Successful outreach call to Ms. Barkow to determine if she is going to proceed with PAP process.  HIPAA identifiers verified.  [facility] Pharmacist explained to patient that she was likely denied in the past due to "checking the box" to state that she had private or commercial insurance. Patient hopeful that this process may now work.  She is having difficulty getting her W2s now that her computer is down.  Patient's daughter is now working on [employer].  Patient stated that she will reach out to me next week when she is able to retrieve documents and mail in packet.  PLAN: -I will reach out to patient within 2 weeks unless call returned sooner.    Regina Eck, PharmD, [place]  [phone]

## 2018-04-13 ENCOUNTER — Ambulatory Visit: Payer: PPO | Admitting: Pharmacist

## 2018-04-14 ENCOUNTER — Ambulatory Visit: Payer: Self-pay | Admitting: Pharmacist

## 2018-04-14 ENCOUNTER — Other Ambulatory Visit: Payer: Self-pay | Admitting: Pharmacy Technician

## 2018-04-14 NOTE — Patient Outreach (Signed)
[facility]) Care Management  04/14/2018  Yesenia Meyers 05-26-1951 [id number]   Incoming call from patient stating she is going to fill out application for Levemir over the weekend. She requested our physical address so that she can bring it to me directly. Provided her with [facility] address and patient states she will bring it to office Monday.  Will follow up with patient in 2-3 business days if she has not brought documents to office.  [name] [place], [place] Management [phone]

## 2018-04-18 ENCOUNTER — Other Ambulatory Visit: Payer: Self-pay | Admitting: Pharmacy Technician

## 2018-04-18 ENCOUNTER — Other Ambulatory Visit: Payer: Self-pay | Admitting: Pharmacist

## 2018-04-18 NOTE — Patient Outreach (Signed)
[place] [facility]) Care Management  04/18/2018  GENESEE NASE 03-23-1951 [id number]   Patient came into [place] Management office to bring patient assistance application for Levemir and required documents. Upon review of documents patient has not spent the required OOP amount to qualify for [employer]. Informed patient that I would make [place] aware and have her contact Dr. Drema Dallas to see if patient can be switched to a [employer] product Environmental health practitioner). Took patient documents and had her sign [employer] patient assistance application as well as a [place] manager of hardship. Patient understood that we would contact her provider to have medication switched.  Ms. Swiney states that she is due to have medication filled next week and that she has 1 & 1/2 pens left of medication.   Patient has lost her cell phone and has given me authorization to leave detailed messages with her sister Chipper Herb ([phone]) and her brother Casimer Leek [phone]).  Will follow up with patient in 2-3 business days with an update as far as if provider is willing to switch medication.  [name] [place], [place] Management [phone]

## 2018-04-18 NOTE — Patient Outreach (Signed)
[place] [facility]) Care Management  04/18/2018  SHAIA PORATH 1951/06/07 [id number]  Incoming call from [place], Etter Sjogren regarding patient assistance for Ms. Kirksey.  Ms. Petron brought in paperwork for PAPs, however has not met the out of pocket expense for [employer] (Levemir).  Patient is agreeable to switch to [employer] Ralph Leyden) if MD approves.  Financial hardship letter was signed by patient while at [facility].  Care coordination call to Dr. Leighton Ruff to see if she will accept PAP and switch to [facility].  A message was left with Dr. Drema Dallas RN to call back by end of day.    PLAN: - Will follow closely as application is time sensitive and patient's insulin supply is low  Regina Eck, PharmD, [place]  [phone]

## 2018-04-20 ENCOUNTER — Other Ambulatory Visit: Payer: Self-pay | Admitting: Pharmacy Technician

## 2018-04-20 ENCOUNTER — Ambulatory Visit: Payer: Self-pay | Admitting: Pharmacist

## 2018-04-20 NOTE — Patient Outreach (Signed)
[place] [facility]) Care Management  04/20/2018  Yesenia Meyers 04-11-1951 [id number]   Received provider portion of [facility] application. Faxed completed application and required documents into [employer] for [employer].  Will follow up with Lilly in 5-7 business days to check status of application.  [name] [place], [place] Management [phone]

## 2018-04-27 ENCOUNTER — Other Ambulatory Visit: Payer: Self-pay | Admitting: Pharmacy Technician

## 2018-04-27 NOTE — Patient Outreach (Signed)
[place] [facility]) Care Management  04/27/2018  Yesenia Meyers 28-Jul-1950 [id number]   Follow up to check status of patients application for Basaglar. [name] confirmed that application is still under review.   Will follow up with patient in 2-3 business days to confirm she has not ran out of medication.  [name] [place], [place] Management [phone]

## 2018-05-01 ENCOUNTER — Other Ambulatory Visit: Payer: Self-pay | Admitting: Pharmacy Technician

## 2018-05-01 NOTE — Patient Outreach (Signed)
[place] [facility]) Care Management  05/01/2018  Yesenia Meyers 1951/06/27 [id number]   Unsuccessful call to patient, HIPAA complaint voice mail left.   Will make 2nd call attempt in 2-3 business days.  [name] [place], [place] Management [phone]

## 2018-05-03 ENCOUNTER — Ambulatory Visit: Payer: Self-pay | Admitting: Pharmacist

## 2018-05-04 ENCOUNTER — Ambulatory Visit: Payer: Self-pay | Admitting: Pharmacy Technician

## 2018-05-04 ENCOUNTER — Other Ambulatory Visit: Payer: Self-pay | Admitting: Pharmacy Technician

## 2018-05-04 NOTE — Patient Outreach (Signed)
[name] Care Management  05/04/2018  Yesenia Meyers 12-14-1950 [id number]   Successful call to patient, HIPAA identifiers verified. Informed patient that her application for Nancee Liter is still under review with [employer]. Confirmed with patient that she is not currently out of medication. She states that she purchased a 1 month supply from pharmacy on 10/13.  Will follow up with patient once I've received an update from [employer] in the next 7 business days.  [name] [place], [place] Management [phone]

## 2018-05-05 ENCOUNTER — Ambulatory Visit: Payer: Self-pay | Admitting: Pharmacist

## 2018-05-05 ENCOUNTER — Other Ambulatory Visit: Payer: Self-pay | Admitting: Pharmacy Technician

## 2018-05-05 NOTE — Patient Outreach (Signed)
[place] [facility]) Care Management  05/05/2018  TALESHIA LUFF [birth year] [id number]  Outreach call placed to [facility] to inquire about patient's medication assistance application for [employer].  Spoke to Floyd who was able to get her approved today 05/05/18 thru 07/10/18. Jasmine placed the order today and confirmed it would be a 1-2 business day processing time, then 3-5 business days for medication to arrive at the provider's office.  Will route note to [place] for followup.

## 2018-05-12 ENCOUNTER — Other Ambulatory Visit: Payer: Self-pay | Admitting: Pharmacist

## 2018-05-12 ENCOUNTER — Other Ambulatory Visit: Payer: Self-pay | Admitting: Pharmacy Technician

## 2018-05-12 NOTE — Patient Outreach (Signed)
[place] [facility]) Care Management  05/12/2018  KASONDRA JUNOD 1950-07-13 [id number]   Successful patient outreach call to Ms. Brodowski with HIPAA identifiers verified.  Patient has received Engineer, agricultural from [employer] patient assistance.  She wanted to confirm that her dose was the same as when she was on Levemir.  [facility] Pharmacist instructed patient to continue current dosing of 24 units in the AM and 30 units in the PM [facility]) unless otherwise directed by her PCP, Dr. Drema Dallas.  Patient verbalizes understanding and is very appreciative of [facility] efforts.  [facility] Pharmacist offered home visit to further review insulin.  Patient declined.  Call back information given to patient if needed.  PLAN: -I will follow up with patient in January to assess insulin supply and the need to switch back to Levemir per insurance formulary   Regina Eck, PharmD, [place]  [phone]

## 2018-05-12 NOTE — Patient Outreach (Signed)
[place] [facility]) Care Management  05/12/2018  Yesenia Meyers 06-14-1951 [id number]   Incoming voicemail left by patient. Patient states she has received her Basaglar from [employer] patient assistance, she does have questions about medication.  Will route note to [place] for consultation and case closure.  [name] [place] Certified Pharmacy Technician [facility] Care Management [phone]

## 2018-05-31 DIAGNOSIS — D72829 Elevated white blood cell count, unspecified: Secondary | ICD-10-CM | POA: Diagnosis not present

## 2018-05-31 DIAGNOSIS — E1165 Type 2 diabetes mellitus with hyperglycemia: Secondary | ICD-10-CM | POA: Diagnosis not present

## 2018-05-31 DIAGNOSIS — D472 Monoclonal gammopathy: Secondary | ICD-10-CM | POA: Diagnosis not present

## 2018-05-31 DIAGNOSIS — E781 Pure hyperglyceridemia: Secondary | ICD-10-CM | POA: Diagnosis not present

## 2018-05-31 DIAGNOSIS — Z23 Encounter for immunization: Secondary | ICD-10-CM | POA: Diagnosis not present

## 2018-05-31 DIAGNOSIS — I10 Essential (primary) hypertension: Secondary | ICD-10-CM | POA: Diagnosis not present

## 2018-05-31 DIAGNOSIS — E1140 Type 2 diabetes mellitus with diabetic neuropathy, unspecified: Secondary | ICD-10-CM | POA: Diagnosis not present

## 2018-06-22 ENCOUNTER — Other Ambulatory Visit: Payer: Self-pay | Admitting: Pharmacy Technician

## 2018-06-22 ENCOUNTER — Other Ambulatory Visit: Payer: Self-pay | Admitting: Pharmacist

## 2018-06-22 NOTE — Patient Outreach (Signed)
[facility]) Care Management  06/22/2018  Yesenia Meyers 10-Aug-1950 [id number]   Successful outreach call to [name] with HIPAA identifiers verified.  Patient would like to apply for Lilly Cares PAP for [place] for 2020.  She states she has been controlled on Basaglar and her PCP if happy with her progress on Basaglar.  She had an appt last month and continues on current dose (24 units in the AM, 30 units in the PM).  PLAN: -I will route PAP letter to [place], Etter Sjogren for [place] patient assistance for 2020. -I will follow up as needed -[facility] CPhT following along  Regina Eck, PharmD, [place]  [phone]

## 2018-06-22 NOTE — Patient Outreach (Signed)
[place] [facility]) Care Management  06/22/2018  TULSI CROSSETT 10-09-1950 [id number]                                                 Medication Assistance Referral  Referral From: [facility] RPh Jenne Pane  Medication/Company: Nancee Liter / Ralph Leyden Cares Patient application portion:  Mail  Provider application portion: Faxed  to Dr. Drema Dallas  Follow up:  Will follow up with patient in 5-7 business days to confirm application(s) have been received.  [name] Chana Bode [place] Certified Pharmacy Technician [place] Management Direct Dial:[phone]

## 2018-07-19 ENCOUNTER — Other Ambulatory Visit: Payer: Self-pay | Admitting: Pharmacy Technician

## 2018-07-19 NOTE — Patient Outreach (Signed)
[place] [facility]) Care Management  07/19/2018  Yesenia Meyers [birth year] [id number]   Received provider portion of Lilly application for [employer]. Faxed completed application and required documents into company.  Will follow up with company in 10-14 business days to check app status.  [name] Chana Bode [place] Certified Pharmacy Technician [place] Management Direct [phone]

## 2018-07-26 ENCOUNTER — Other Ambulatory Visit: Payer: Self-pay | Admitting: Pharmacy Technician

## 2018-07-26 NOTE — Patient Outreach (Addendum)
[place] [facility]) Care Management  07/26/2018  KENYADA DOSCH [birth year] [id number]     Follow up call placed to [facility]  regarding patient assistance application(s) for Basaglar , Representative confirms that patient has been approved as of 01/14 until 07/12/2019. Order to be shipped out to providers office on 1/16 and medication should arrive in 7-10 business days.     Unsuccessful call #1 placed to patient regarding patient assistance update for Basaglar, HIPAA compliant voicemail left.   Will make 2nd call attempt in 2-3 business days if call has not been returned.   [name] Chana Bode [place] Certified Pharmacy Technician [place] Management Direct Dial:[phone]

## 2018-07-31 ENCOUNTER — Ambulatory Visit: Payer: Self-pay | Admitting: Pharmacy Technician

## 2018-08-21 ENCOUNTER — Other Ambulatory Visit: Payer: Self-pay | Admitting: Pharmacist

## 2018-08-21 NOTE — Patient Outreach (Addendum)
[place] [facility]) Care Management  [place]  08/21/2018  Yesenia Meyers [birth year] [id number]   Reason for call: confirm patient received PAP insulin from MD office (Basaglar/Humalog)  Unsuccessful telephone call attempt to patient.   HIPAA compliant voicemail left requesting a return call  Plan:  Patient to reach out to PCP and [facility] Pharmacist.  [facility] CPhT also contacting patient .   Regina Eck, PharmD, [place]  [phone]

## 2018-09-07 ENCOUNTER — Other Ambulatory Visit: Payer: Self-pay | Admitting: Pharmacy Technician

## 2018-09-07 NOTE — Patient Outreach (Signed)
[place] [facility]) Care Management  09/07/2018  Yesenia Meyers 06-14-1951 [id number]   [place] and I have made multiple calls to patient to verify the receipt of Basaglar thru [employer] patient assistance.  [place] confirms patients 5 boxes of Basaglar was delivered to Dr. Drema Dallas office on 07/28/2018. Patient has been approved until 07/12/2019.  Will route note to [place] it for case closure.  [name] Chana Bode [place] Certified Pharmacy Technician [place] Management Direct Dial:[phone]

## 2018-09-12 DIAGNOSIS — Z794 Long term (current) use of insulin: Secondary | ICD-10-CM | POA: Diagnosis not present

## 2018-09-12 DIAGNOSIS — Z800 Family history of malignant neoplasm of digestive organs: Secondary | ICD-10-CM | POA: Diagnosis not present

## 2018-09-12 DIAGNOSIS — Z1211 Encounter for screening for malignant neoplasm of colon: Secondary | ICD-10-CM | POA: Diagnosis not present

## 2018-09-12 DIAGNOSIS — I10 Essential (primary) hypertension: Secondary | ICD-10-CM | POA: Diagnosis not present

## 2018-09-12 DIAGNOSIS — E781 Pure hyperglyceridemia: Secondary | ICD-10-CM | POA: Diagnosis not present

## 2018-09-12 DIAGNOSIS — E1165 Type 2 diabetes mellitus with hyperglycemia: Secondary | ICD-10-CM | POA: Diagnosis not present

## 2018-09-12 DIAGNOSIS — E1140 Type 2 diabetes mellitus with diabetic neuropathy, unspecified: Secondary | ICD-10-CM | POA: Diagnosis not present

## 2018-09-12 DIAGNOSIS — D72829 Elevated white blood cell count, unspecified: Secondary | ICD-10-CM | POA: Diagnosis not present

## 2018-09-20 ENCOUNTER — Other Ambulatory Visit: Payer: Self-pay | Admitting: Pharmacist

## 2019-01-11 ENCOUNTER — Telehealth: Payer: Self-pay | Admitting: Hematology

## 2019-01-11 NOTE — Telephone Encounter (Signed)
Scheduled appt per 7/2 sch message - unable to reach pt . And unable to leave message - mailed letter with appt date and time

## 2019-01-30 ENCOUNTER — Telehealth: Payer: Self-pay | Admitting: Hematology

## 2019-01-30 NOTE — Telephone Encounter (Signed)
[place] PAL 7/30 appointments moved from 7/30 to 8/6. Confirmed with patient.

## 2019-02-08 ENCOUNTER — Ambulatory Visit: Payer: PPO | Admitting: Hematology

## 2019-02-08 ENCOUNTER — Other Ambulatory Visit: Payer: PPO

## 2019-02-14 ENCOUNTER — Other Ambulatory Visit: Payer: Self-pay | Admitting: *Deleted

## 2019-02-14 DIAGNOSIS — D72829 Elevated white blood cell count, unspecified: Secondary | ICD-10-CM

## 2019-02-14 NOTE — Progress Notes (Signed)
HEMATOLOGY/ONCOLOGY CLINIC NOTE  Date of Service: 02/15/2019  Patient Care Team: Leighton Ruff, MD as PCP - General (Family Medicine)  CHIEF COMPLAINTS/PURPOSE OF CONSULTATION:  Leucocytosis MGUS   HISTORY OF PRESENTING ILLNESS:   Yesenia Meyers is a wonderful 68 y.o. female who has been referred to [place] by Dr ..Leighton Ruff, MD  for evaluation of leucocytosis.  Patient has a h/o DM2, IDA, HTN and notes severe rt shoulder pain and decreased ROM since Oct 2017 when she had a fall with trauma to the rt shoulder. She notes that she has been scheduled for surgery soon and is very keen to proceed with surgery.  Her CBC done with her PCP show mild leucocytosis of 12.4-14k from atleast 03/09/2016 with predominantly neutrophilia with ANC 7.7-8.9k. With borderline stable lymphocytosis with lymph # of around 3.8k and borderline monocytosis 1-1.1k.  Her hgb/hct and platelet counts are WNL.  She currently notes rt shoulder pain. No fevers/chills/night sweats/unexplained weight loss/enlarged LN or other overt new focal symptoms.  She notes that she did have a bad dental/gum infection and required antibiotics. No symptoms suggestive of another overt source of infection Denies being on steroids recently.  INTERVAL HISTORY  Yesenia Meyers is a 68 y.o. female presenting today for the evaluation and management of her leukocytosis. The patient's last visit with [place] was on 02/14/2017. The pt reports that she is doing well overall.   The pt reports that her shoulder is doing well and is back to normal. Her diabetes is in control. She has not used prednisone or any other steroids recently. Denies recent infections.  She denies weight changes, fevers, chills, night sweats, new lumps or bumps, new skin rashes. She is experiencing some left arm pain which began after she carried groceries inside. She reports that she loses her balance every once in a while and uses a cane.   Lab results  today (02/15/2019) of CBC w/diff and CMP is as follows: all values are WNL except for WBC at 14.1k, neutro abs at 8.5k, monocytes abs at 1.1k. 02/15/2019 Reticulocytes revealed immature retic fract at 16.8, glucose bld at 285, Creatinine at 1.13 02/15/2019 LDH is pending  On review of systems, pt reports losing her balance occasionally, left arm pain, and denies fever, new back pain, belly pain, chills, night sweats, new rashes, recent infections, new lumps or bumps, and any other symptoms.  MEDICAL HISTORY:  Past Medical History:  Diagnosis Date  . Atypical chest pain    Related to anxiety   . Diabetes mellitus without complication ([place])   . DOE (dyspnea on exertion)   . Hyperlipemia   . Hypertension   iron deficiency Anemia  SURGICAL HISTORY: Past Surgical History:  Procedure Laterality Date  . BACK SURGERY  1986   Crushed 2 discs   . BACK SURGERY  2004   Spinal stenosis  . CARPAL TUNNEL RELEASE Bilateral   . KNEE SURGERY Left    68 y.o.  . PARTIAL HYSTERECTOMY N/A    Age 78  . TUBAL LIGATION  1974    SOCIAL HISTORY: Social History   Socioeconomic History  . Marital status: Divorced    Spouse name: Not on file  . Number of children: Not on file  . Years of education: Not on file  . Highest education level: Not on file  Occupational History  . Not on file  Social Needs  . Financial resource strain: Not on file  . Food insecurity  Worry: Not on file    Inability: Not on file  . Transportation needs    Medical: Not on file    Non-medical: Not on file  Tobacco Use  . Smoking status: Never Smoker  . Smokeless tobacco: Never Used  Substance and Sexual Activity  . Alcohol use: No  . Drug use: No  . Sexual activity: Yes  Lifestyle  . Physical activity    Days per week: Not on file    Minutes per session: Not on file  . Stress: Not on file  Relationships  . Social Herbalist on phone: Not on file    Gets together: Not on file    Attends  religious service: Not on file    Active member of club or organization: Not on file    Attends meetings of clubs or organizations: Not on file    Relationship status: Not on file  . Intimate partner violence    Fear of current or ex partner: Not on file    Emotionally abused: Not on file    Physically abused: Not on file    Forced sexual activity: Not on file  Other Topics Concern  . Not on file  Social History Narrative  . Not on file    FAMILY HISTORY: Family History  Problem Relation Age of Onset  . Heart disease Mother        3's  . Diabetes Mother   . Hypertension Mother   . Heart disease Father        64's  . Diabetes Father   . Hypertension Father   . Diabetes Sister   . Diabetes Brother   . Cancer Brother        Colon  . Diabetes Brother     ALLERGIES:  is allergic to celebrex [celecoxib]; feldene [piroxicam]; and voltaren [diclofenac sodium].  MEDICATIONS:  Current Outpatient Medications  Medication Sig Dispense Refill  . acetaminophen (TYLENOL) 500 MG tablet Take 500 mg by mouth every 6 (six) hours as needed (Pt does not take unless severe pain.).     Marland Kitchen atorvastatin (LIPITOR) 20 MG tablet Take 20 mg by mouth daily.    . Biotin 10000 MCG TABS Take 1,000 Units by mouth daily.    . Insulin Glargine (BASAGLAR KWIKPEN) 100 UNIT/ML SOPN Inject into the skin 2 (two) times daily. 24 units in the AM and 30 units in the evening    . losartan-hydrochlorothiazide (HYZAAR) 100-12.5 MG tablet Take 1 tablet by mouth daily.    . metFORMIN (GLUCOPHAGE) 500 MG tablet Take 500 mg by mouth 2 (two) times daily with a meal.    . Multiple Vitamin (MULTI-VITAMIN DAILY PO) Take 1 tablet by mouth daily.    . Omega-3 Fatty Acids (FISH OIL) 1000 MG CAPS Take 1,000 mg by mouth daily.     No current facility-administered medications for this visit.     REVIEW OF SYSTEMS:    A 10+ POINT REVIEW OF SYSTEMS WAS OBTAINED including neurology, dermatology, psychiatry, cardiac, respiratory,  lymph, extremities, GI, GU, Musculoskeletal, constitutional, breasts, reproductive, HEENT.  All pertinent positives are noted in the HPI.  All others are negative.   PHYSICAL EXAMINATION: ECOG PERFORMANCE STATUS: 1 - Symptomatic but completely ambulatory  . Vitals:   02/15/19 1503  BP: (!) 146/67  Pulse: 92  Resp: 18  Temp: 98.9 F (37.2 C)  SpO2: 98%   Filed Weights   02/15/19 1503  Weight: 180 lb 3.2 oz (81.7 kg)   .  Body mass index is 35.19 kg/m.   GENERAL:alert, in no acute distress and comfortable SKIN: no acute rashes, no significant lesions EYES: conjunctiva are pink and non-injected, sclera anicteric OROPHARYNX: MMM, no exudates, no oropharyngeal erythema or ulceration NECK: supple, no JVD LYMPH:  no palpable lymphadenopathy in the cervical, axillary or inguinal regions LUNGS: clear to auscultation b/l with normal respiratory effort HEART: regular rate & rhythm ABDOMEN:  normoactive bowel sounds , non tender, not distended. No palpable hepatosplenomegaly.  Extremity: no pedal edema PSYCH: alert & oriented x 3 with fluent speech NEURO: no focal motor/sensory deficits   LABORATORY DATA:  I have reviewed the data as listed  . CBC Latest Ref Rng & Units 02/15/2019 01/06/2017  WBC 4.0 - 10.5 K/uL 14.1(H) 14.6(H)  Hemoglobin 12.0 - 15.0 g/dL 14.0 15.1  Hematocrit 36.0 - 46.0 % 42.5 44.6  Platelets 150 - 400 K/uL 279 298   . CBC    Component Value Date/Time   WBC 14.1 (H) 02/15/2019 1401   WBC 14.6 (H) 01/06/2017 1611   RBC 4.76 02/15/2019 1402   RBC 4.74 02/15/2019 1401   HGB 14.0 02/15/2019 1401   HGB 15.1 01/06/2017 1611   HCT 42.5 02/15/2019 1401   HCT 44.6 01/06/2017 1611   PLT 279 02/15/2019 1401   PLT 298 01/06/2017 1611   MCV 89.7 02/15/2019 1401   MCV 90.7 01/06/2017 1611   MCH 29.5 02/15/2019 1401   MCHC 32.9 02/15/2019 1401   RDW 12.7 02/15/2019 1401   RDW 12.6 01/06/2017 1611   LYMPHSABS 4.0 02/15/2019 1401   LYMPHSABS 4.1 (H) 01/06/2017 1611    MONOABS 1.1 (H) 02/15/2019 1401   MONOABS 1.2 (H) 01/06/2017 1611   EOSABS 0.3 02/15/2019 1401   EOSABS 0.2 01/06/2017 1611   BASOSABS 0.1 02/15/2019 1401   BASOSABS 0.0 01/06/2017 1611   ANC 9k . CMP Latest Ref Rng & Units 02/15/2019 01/06/2017 01/06/2017  Glucose 70 - 99 mg/dL 285(H) 144(H) -  BUN 8 - 23 mg/dL 14 13.4 -  Creatinine 0.44 - 1.00 mg/dL 1.13(H) 1.0 -  Sodium 135 - 145 mmol/L 140 142 -  Potassium 3.5 - 5.1 mmol/L 4.6 3.7 -  Chloride 98 - 111 mmol/L 102 - -  CO2 22 - 32 mmol/L 26 28 -  Calcium 8.9 - 10.3 mg/dL 9.8 10.2 -  Total Protein 6.5 - 8.1 g/dL 7.5 8.2 7.8  Total Bilirubin 0.3 - 1.2 mg/dL 1.0 0.88 -  Alkaline Phos 38 - 126 U/L 78 81 -  AST 15 - 41 U/L 29 27 -  ALT 0 - 44 U/L 37 31 -         RADIOGRAPHIC STUDIES: I have personally reviewed the radiological images as listed and agreed with the findings in the report. No results found.  ASSESSMENT & PLAN:   68 yo with incidentally noted leucocytosis on labs  1) Chronic persistent Neutrophilia  Present since at least 02/2016. No significant blasts or left shift on peripheral blood smear.  LDH WNL Sed rate WNL Appears likely reactive ? Uncontrolled rt shoulder pain. R/o inflammation in rt shoulder. Did have issues with dental /periodontal infection in march 2018. No other overt source of infection No palpable hepato-splenomegaly or LNadenopathy to suggest lymphoproliferative or myeloproliferative process.  2) Lymphocytosis - non clonal on flow cytometry  3) Mild monocytosis.  4) Newly noted monoclonal IgA Kappa Only small amount of M spike of 0.4g/dl with normal LDH , sed rate and no associated overt CRAB criteria.  Likely MGUS.  PLAN -Discussed pt labwork today, 02/15/2019; elevated neutrophils, lymphocytes are about the same as her last visit, blood chemistries are stable -Discussed that at the time of her last visit in 2018, we were following her for elevated lymphocytes and neutrophils -- Flow  cytometry was negative -The fact that her WBC are not progressively increasing suggests a reactive process.  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 Orders Placed This Encounter  Procedures  . Multiple Myeloma Panel (SPEP&IFE w/QIG)    Standing Status:   Future    Standing Expiration Date:   02/15/2020  . Kappa/lambda light chains    Standing Status:   Future    Standing Expiration Date:   03/21/2020  . Multiple Myeloma Panel (SPEP&IFE w/QIG)    Standing Status:   Future    Standing Expiration Date:   02/15/2020    Additional labs today RTC with Dr Irene Limbo with labs in 7yr  All of the patients questions were answered with apparent satisfaction. The patient knows to call the clinic with any problems, questions or concerns.  The total time spent in the appt was 20 minutes and more than 50% was on counseling and direct patient cares.   GSullivan LoneMD MS AAHIVMS SInterfaith Medical CenterCDelaware Surgery Center LLCHematology/Oncology Physician CMidtown Endoscopy Center LLC (Office):       [phone](Work cell):  [phone](Fax):           [phone] 02/15/2019 3:37 PM   I, EDe Burrs am acting as a scribe for Dr. KIrene Limbo .I have reviewed the above documentation for accuracy and completeness, and I agree with the above. .Brunetta GeneraMD

## 2019-02-15 ENCOUNTER — Inpatient Hospital Stay (HOSPITAL_BASED_OUTPATIENT_CLINIC_OR_DEPARTMENT_OTHER): Payer: PPO | Admitting: Hematology

## 2019-02-15 ENCOUNTER — Telehealth: Payer: Self-pay | Admitting: Hematology

## 2019-02-15 ENCOUNTER — Other Ambulatory Visit: Payer: Self-pay

## 2019-02-15 ENCOUNTER — Inpatient Hospital Stay: Payer: PPO | Attending: Hematology

## 2019-02-15 ENCOUNTER — Inpatient Hospital Stay: Payer: PPO

## 2019-02-15 VITALS — BP 146/67 | HR 92 | Temp 98.9°F | Resp 18 | Ht 60.0 in | Wt 180.2 lb

## 2019-02-15 DIAGNOSIS — Z79899 Other long term (current) drug therapy: Secondary | ICD-10-CM | POA: Insufficient documentation

## 2019-02-15 DIAGNOSIS — I10 Essential (primary) hypertension: Secondary | ICD-10-CM | POA: Insufficient documentation

## 2019-02-15 DIAGNOSIS — D472 Monoclonal gammopathy: Secondary | ICD-10-CM

## 2019-02-15 DIAGNOSIS — E119 Type 2 diabetes mellitus without complications: Secondary | ICD-10-CM | POA: Diagnosis not present

## 2019-02-15 DIAGNOSIS — D72829 Elevated white blood cell count, unspecified: Secondary | ICD-10-CM

## 2019-02-15 DIAGNOSIS — Z794 Long term (current) use of insulin: Secondary | ICD-10-CM | POA: Diagnosis not present

## 2019-02-15 DIAGNOSIS — M25511 Pain in right shoulder: Secondary | ICD-10-CM | POA: Insufficient documentation

## 2019-02-15 DIAGNOSIS — D509 Iron deficiency anemia, unspecified: Secondary | ICD-10-CM | POA: Insufficient documentation

## 2019-02-15 DIAGNOSIS — Z90712 Acquired absence of cervix with remaining uterus: Secondary | ICD-10-CM | POA: Diagnosis not present

## 2019-02-15 LAB — CMP (CANCER CENTER ONLY)
ALT: 37 U/L (ref 0–44)
AST: 29 U/L (ref 15–41)
Albumin: 3.9 g/dL (ref 3.5–5.0)
Alkaline Phosphatase: 78 U/L (ref 38–126)
Anion gap: 12 (ref 5–15)
BUN: 14 mg/dL (ref 8–23)
CO2: 26 mmol/L (ref 22–32)
Calcium: 9.8 mg/dL (ref 8.9–10.3)
Chloride: 102 mmol/L (ref 98–111)
Creatinine: 1.13 mg/dL — ABNORMAL HIGH (ref 0.44–1.00)
GFR, Est AFR Am: 57 mL/min — ABNORMAL LOW (ref >60)
GFR, Estimated: 50 mL/min — ABNORMAL LOW (ref >60)
Glucose, Bld: 285 mg/dL — ABNORMAL HIGH (ref 70–99)
Potassium: 4.6 mmol/L (ref 3.5–5.1)
Sodium: 140 mmol/L (ref 135–145)
Total Bilirubin: 1 mg/dL (ref 0.3–1.2)
Total Protein: 7.5 g/dL (ref 6.5–8.1)

## 2019-02-15 LAB — CBC WITH DIFFERENTIAL (CANCER CENTER ONLY)
Abs Immature Granulocytes: 0.07 10*3/uL (ref 0.00–0.07)
Basophils Absolute: 0.1 10*3/uL (ref 0.0–0.1)
Basophils Relative: 1 %
Eosinophils Absolute: 0.3 10*3/uL (ref 0.0–0.5)
Eosinophils Relative: 2 %
HCT: 42.5 % (ref 36.0–46.0)
Hemoglobin: 14 g/dL (ref 12.0–15.0)
Immature Granulocytes: 1 %
Lymphocytes Relative: 28 %
Lymphs Abs: 4 10*3/uL (ref 0.7–4.0)
MCH: 29.5 pg (ref 26.0–34.0)
MCHC: 32.9 g/dL (ref 30.0–36.0)
MCV: 89.7 fL (ref 80.0–100.0)
Monocytes Absolute: 1.1 10*3/uL — ABNORMAL HIGH (ref 0.1–1.0)
Monocytes Relative: 8 %
Neutro Abs: 8.5 10*3/uL — ABNORMAL HIGH (ref 1.7–7.7)
Neutrophils Relative %: 60 %
Platelet Count: 279 10*3/uL (ref 150–400)
RBC: 4.74 MIL/uL (ref 3.87–5.11)
RDW: 12.7 % (ref 11.5–15.5)
WBC Count: 14.1 10*3/uL — ABNORMAL HIGH (ref 4.0–10.5)
nRBC: 0 % (ref 0.0–0.2)

## 2019-02-15 LAB — RETICULOCYTES
Immature Retic Fract: 16.8 % — ABNORMAL HIGH (ref 2.3–15.9)
RBC.: 4.76 MIL/uL (ref 3.87–5.11)
Retic Count, Absolute: 78.1 10*3/uL (ref 19.0–186.0)
Retic Ct Pct: 1.6 % (ref 0.4–3.1)

## 2019-02-15 LAB — LACTATE DEHYDROGENASE: LDH: 184 U/L (ref 98–192)

## 2019-02-15 NOTE — Telephone Encounter (Signed)
Scheduled appt per 8/6 los. ° °Printed calendar and avs. °

## 2019-02-16 LAB — KAPPA/LAMBDA LIGHT CHAINS
Kappa free light chain: 56.8 mg/L — ABNORMAL HIGH (ref 3.3–19.4)
Kappa, lambda light chain ratio: 3.23 — ABNORMAL HIGH (ref 0.26–1.65)
Lambda free light chains: 17.6 mg/L (ref 5.7–26.3)

## 2019-02-19 LAB — MULTIPLE MYELOMA PANEL, SERUM
Albumin SerPl Elph-Mcnc: 4 g/dL (ref 2.9–4.4)
Albumin/Glob SerPl: 1.4 (ref 0.7–1.7)
Alpha 1: 0.2 g/dL (ref 0.0–0.4)
Alpha2 Glob SerPl Elph-Mcnc: 1 g/dL (ref 0.4–1.0)
B-Globulin SerPl Elph-Mcnc: 1.3 g/dL (ref 0.7–1.3)
Gamma Glob SerPl Elph-Mcnc: 0.5 g/dL (ref 0.4–1.8)
Globulin, Total: 2.9 g/dL (ref 2.2–3.9)
IgA: 652 mg/dL — ABNORMAL HIGH (ref 87–352)
IgG (Immunoglobin G), Serum: 773 mg/dL (ref 586–1602)
IgM (Immunoglobulin M), Srm: 124 mg/dL (ref 26–217)
M Protein SerPl Elph-Mcnc: 0.3 g/dL — ABNORMAL HIGH
Total Protein ELP: 6.9 g/dL (ref 6.0–8.5)

## 2019-03-13 DIAGNOSIS — I10 Essential (primary) hypertension: Secondary | ICD-10-CM | POA: Diagnosis not present

## 2019-03-13 DIAGNOSIS — E1165 Type 2 diabetes mellitus with hyperglycemia: Secondary | ICD-10-CM | POA: Diagnosis not present

## 2019-03-13 DIAGNOSIS — D72829 Elevated white blood cell count, unspecified: Secondary | ICD-10-CM | POA: Diagnosis not present

## 2019-03-13 DIAGNOSIS — E878 Other disorders of electrolyte and fluid balance, not elsewhere classified: Secondary | ICD-10-CM | POA: Diagnosis not present

## 2019-03-13 DIAGNOSIS — E785 Hyperlipidemia, unspecified: Secondary | ICD-10-CM | POA: Diagnosis not present

## 2019-03-13 DIAGNOSIS — E1140 Type 2 diabetes mellitus with diabetic neuropathy, unspecified: Secondary | ICD-10-CM | POA: Diagnosis not present

## 2019-03-13 DIAGNOSIS — E781 Pure hyperglyceridemia: Secondary | ICD-10-CM | POA: Diagnosis not present

## 2019-03-16 DIAGNOSIS — E785 Hyperlipidemia, unspecified: Secondary | ICD-10-CM | POA: Diagnosis not present

## 2019-03-16 DIAGNOSIS — D72829 Elevated white blood cell count, unspecified: Secondary | ICD-10-CM | POA: Diagnosis not present

## 2019-03-16 DIAGNOSIS — E1140 Type 2 diabetes mellitus with diabetic neuropathy, unspecified: Secondary | ICD-10-CM | POA: Diagnosis not present

## 2019-03-16 DIAGNOSIS — E1165 Type 2 diabetes mellitus with hyperglycemia: Secondary | ICD-10-CM | POA: Diagnosis not present

## 2019-03-16 DIAGNOSIS — Z794 Long term (current) use of insulin: Secondary | ICD-10-CM | POA: Diagnosis not present

## 2019-03-16 DIAGNOSIS — I10 Essential (primary) hypertension: Secondary | ICD-10-CM | POA: Diagnosis not present

## 2019-03-16 DIAGNOSIS — E781 Pure hyperglyceridemia: Secondary | ICD-10-CM | POA: Diagnosis not present

## 2019-03-16 DIAGNOSIS — D472 Monoclonal gammopathy: Secondary | ICD-10-CM | POA: Diagnosis not present

## 2019-04-03 DIAGNOSIS — E781 Pure hyperglyceridemia: Secondary | ICD-10-CM | POA: Diagnosis not present

## 2019-04-03 DIAGNOSIS — E1165 Type 2 diabetes mellitus with hyperglycemia: Secondary | ICD-10-CM | POA: Diagnosis not present

## 2019-04-03 DIAGNOSIS — E1140 Type 2 diabetes mellitus with diabetic neuropathy, unspecified: Secondary | ICD-10-CM | POA: Diagnosis not present

## 2019-04-03 DIAGNOSIS — I10 Essential (primary) hypertension: Secondary | ICD-10-CM | POA: Diagnosis not present

## 2019-04-03 DIAGNOSIS — E785 Hyperlipidemia, unspecified: Secondary | ICD-10-CM | POA: Diagnosis not present

## 2019-05-04 DIAGNOSIS — E1165 Type 2 diabetes mellitus with hyperglycemia: Secondary | ICD-10-CM | POA: Diagnosis not present

## 2019-05-04 DIAGNOSIS — I10 Essential (primary) hypertension: Secondary | ICD-10-CM | POA: Diagnosis not present

## 2019-05-04 DIAGNOSIS — E781 Pure hyperglyceridemia: Secondary | ICD-10-CM | POA: Diagnosis not present

## 2019-05-04 DIAGNOSIS — E1140 Type 2 diabetes mellitus with diabetic neuropathy, unspecified: Secondary | ICD-10-CM | POA: Diagnosis not present

## 2019-05-04 DIAGNOSIS — E785 Hyperlipidemia, unspecified: Secondary | ICD-10-CM | POA: Diagnosis not present

## 2019-05-29 DIAGNOSIS — Z794 Long term (current) use of insulin: Secondary | ICD-10-CM | POA: Diagnosis not present

## 2019-05-29 DIAGNOSIS — I10 Essential (primary) hypertension: Secondary | ICD-10-CM | POA: Diagnosis not present

## 2019-05-29 DIAGNOSIS — E1140 Type 2 diabetes mellitus with diabetic neuropathy, unspecified: Secondary | ICD-10-CM | POA: Diagnosis not present

## 2019-05-29 DIAGNOSIS — E785 Hyperlipidemia, unspecified: Secondary | ICD-10-CM | POA: Diagnosis not present

## 2019-05-29 DIAGNOSIS — E781 Pure hyperglyceridemia: Secondary | ICD-10-CM | POA: Diagnosis not present

## 2019-05-29 DIAGNOSIS — E1165 Type 2 diabetes mellitus with hyperglycemia: Secondary | ICD-10-CM | POA: Diagnosis not present

## 2019-06-27 DIAGNOSIS — E1140 Type 2 diabetes mellitus with diabetic neuropathy, unspecified: Secondary | ICD-10-CM | POA: Diagnosis not present

## 2019-06-27 DIAGNOSIS — E1165 Type 2 diabetes mellitus with hyperglycemia: Secondary | ICD-10-CM | POA: Diagnosis not present

## 2019-06-27 DIAGNOSIS — I10 Essential (primary) hypertension: Secondary | ICD-10-CM | POA: Diagnosis not present

## 2019-06-27 DIAGNOSIS — E781 Pure hyperglyceridemia: Secondary | ICD-10-CM | POA: Diagnosis not present

## 2019-06-27 DIAGNOSIS — E785 Hyperlipidemia, unspecified: Secondary | ICD-10-CM | POA: Diagnosis not present

## 2019-06-27 DIAGNOSIS — Z794 Long term (current) use of insulin: Secondary | ICD-10-CM | POA: Diagnosis not present

## 2019-07-24 DIAGNOSIS — E785 Hyperlipidemia, unspecified: Secondary | ICD-10-CM | POA: Diagnosis not present

## 2019-07-24 DIAGNOSIS — E781 Pure hyperglyceridemia: Secondary | ICD-10-CM | POA: Diagnosis not present

## 2019-07-24 DIAGNOSIS — I10 Essential (primary) hypertension: Secondary | ICD-10-CM | POA: Diagnosis not present

## 2019-07-24 DIAGNOSIS — E1165 Type 2 diabetes mellitus with hyperglycemia: Secondary | ICD-10-CM | POA: Diagnosis not present

## 2019-07-24 DIAGNOSIS — E1140 Type 2 diabetes mellitus with diabetic neuropathy, unspecified: Secondary | ICD-10-CM | POA: Diagnosis not present

## 2019-08-24 DIAGNOSIS — E781 Pure hyperglyceridemia: Secondary | ICD-10-CM | POA: Diagnosis not present

## 2019-08-24 DIAGNOSIS — E1140 Type 2 diabetes mellitus with diabetic neuropathy, unspecified: Secondary | ICD-10-CM | POA: Diagnosis not present

## 2019-08-24 DIAGNOSIS — E785 Hyperlipidemia, unspecified: Secondary | ICD-10-CM | POA: Diagnosis not present

## 2019-08-24 DIAGNOSIS — I10 Essential (primary) hypertension: Secondary | ICD-10-CM | POA: Diagnosis not present

## 2019-08-24 DIAGNOSIS — E1165 Type 2 diabetes mellitus with hyperglycemia: Secondary | ICD-10-CM | POA: Diagnosis not present

## 2019-09-25 DIAGNOSIS — E785 Hyperlipidemia, unspecified: Secondary | ICD-10-CM | POA: Diagnosis not present

## 2019-09-25 DIAGNOSIS — E781 Pure hyperglyceridemia: Secondary | ICD-10-CM | POA: Diagnosis not present

## 2019-09-25 DIAGNOSIS — E1140 Type 2 diabetes mellitus with diabetic neuropathy, unspecified: Secondary | ICD-10-CM | POA: Diagnosis not present

## 2019-09-25 DIAGNOSIS — I10 Essential (primary) hypertension: Secondary | ICD-10-CM | POA: Diagnosis not present

## 2019-09-25 DIAGNOSIS — E1165 Type 2 diabetes mellitus with hyperglycemia: Secondary | ICD-10-CM | POA: Diagnosis not present

## 2019-10-25 DIAGNOSIS — H52223 Regular astigmatism, bilateral: Secondary | ICD-10-CM | POA: Diagnosis not present

## 2019-10-25 DIAGNOSIS — H5203 Hypermetropia, bilateral: Secondary | ICD-10-CM | POA: Diagnosis not present

## 2019-10-25 DIAGNOSIS — H524 Presbyopia: Secondary | ICD-10-CM | POA: Diagnosis not present

## 2019-10-25 DIAGNOSIS — E119 Type 2 diabetes mellitus without complications: Secondary | ICD-10-CM | POA: Diagnosis not present

## 2019-10-30 DIAGNOSIS — M79673 Pain in unspecified foot: Secondary | ICD-10-CM | POA: Diagnosis not present

## 2019-11-05 DIAGNOSIS — I10 Essential (primary) hypertension: Secondary | ICD-10-CM | POA: Diagnosis not present

## 2019-11-05 DIAGNOSIS — E1140 Type 2 diabetes mellitus with diabetic neuropathy, unspecified: Secondary | ICD-10-CM | POA: Diagnosis not present

## 2019-11-05 DIAGNOSIS — E1165 Type 2 diabetes mellitus with hyperglycemia: Secondary | ICD-10-CM | POA: Diagnosis not present

## 2019-11-05 DIAGNOSIS — E785 Hyperlipidemia, unspecified: Secondary | ICD-10-CM | POA: Diagnosis not present

## 2019-11-05 DIAGNOSIS — E781 Pure hyperglyceridemia: Secondary | ICD-10-CM | POA: Diagnosis not present

## 2019-11-14 DIAGNOSIS — E781 Pure hyperglyceridemia: Secondary | ICD-10-CM | POA: Diagnosis not present

## 2019-11-14 DIAGNOSIS — E785 Hyperlipidemia, unspecified: Secondary | ICD-10-CM | POA: Diagnosis not present

## 2019-11-14 DIAGNOSIS — E1140 Type 2 diabetes mellitus with diabetic neuropathy, unspecified: Secondary | ICD-10-CM | POA: Diagnosis not present

## 2019-11-14 DIAGNOSIS — E1165 Type 2 diabetes mellitus with hyperglycemia: Secondary | ICD-10-CM | POA: Diagnosis not present

## 2019-11-14 DIAGNOSIS — I10 Essential (primary) hypertension: Secondary | ICD-10-CM | POA: Diagnosis not present

## 2019-11-28 ENCOUNTER — Ambulatory Visit: Payer: PPO | Admitting: Podiatry

## 2019-11-28 ENCOUNTER — Encounter: Payer: Self-pay | Admitting: Podiatry

## 2019-11-28 ENCOUNTER — Ambulatory Visit (INDEPENDENT_AMBULATORY_CARE_PROVIDER_SITE_OTHER): Payer: PPO

## 2019-11-28 ENCOUNTER — Other Ambulatory Visit: Payer: Self-pay

## 2019-11-28 DIAGNOSIS — L84 Corns and callosities: Secondary | ICD-10-CM

## 2019-11-28 DIAGNOSIS — M79671 Pain in right foot: Secondary | ICD-10-CM

## 2019-12-01 NOTE — Progress Notes (Signed)
   HPI: 69 y.o. female with PMHx of diabetes mellitus presenting today as a new patient with a chief complaint of a "black spot" between the 4th and 5th digits of the right foot that appeared about two months ago. She reports associated soreness and tenderness to the touch. Walking increases the pain. She has been using a bandage over the fifth digit for treatment. Patient is here for further evaluation and treatment.   Past Medical History:  Diagnosis Date  . Atypical chest pain    Related to anxiety   . Diabetes mellitus without complication ([place])   . DOE (dyspnea on exertion)   . Hyperlipemia   . Hypertension      Physical Exam: General: The patient is alert and oriented x3 in no acute distress.  Dermatology: Hyperkeratotic tissue noted to the 4th interdigital webspace of the right foot. Skin is warm, dry and supple bilateral lower extremities. Negative for open lesions or macerations.  Vascular: Palpable pedal pulses bilaterally. No edema or erythema noted. Capillary refill within normal limits.  Neurological: Epicritic and protective threshold grossly intact bilaterally.   Musculoskeletal Exam: Range of motion within normal limits to all pedal and ankle joints bilateral. Muscle strength 5/5 in all groups bilateral.   Radiographic Exam:  Normal osseous mineralization. Joint spaces preserved. No fracture/dislocation/boney destruction.    Assessment: 1. Heloma molle right    Plan of Care:  1. Patient evaluated. X-Rays reviewed.  2. Excisional debridement of keratotic lesion(s) using a chisel blade was performed without incident. Light dressing applied.  3. Recommended wide fitting shoes.  4. Return to clinic as needed.     Edrick Kins, DPM [facility]  Dr. Edrick Kins, DPM    [address], [place] [address]                Office [phone]  Fax [phone]

## 2019-12-13 DIAGNOSIS — E785 Hyperlipidemia, unspecified: Secondary | ICD-10-CM | POA: Diagnosis not present

## 2019-12-13 DIAGNOSIS — E1140 Type 2 diabetes mellitus with diabetic neuropathy, unspecified: Secondary | ICD-10-CM | POA: Diagnosis not present

## 2019-12-13 DIAGNOSIS — I10 Essential (primary) hypertension: Secondary | ICD-10-CM | POA: Diagnosis not present

## 2019-12-13 DIAGNOSIS — E781 Pure hyperglyceridemia: Secondary | ICD-10-CM | POA: Diagnosis not present

## 2019-12-13 DIAGNOSIS — E1165 Type 2 diabetes mellitus with hyperglycemia: Secondary | ICD-10-CM | POA: Diagnosis not present

## 2020-01-23 DIAGNOSIS — E781 Pure hyperglyceridemia: Secondary | ICD-10-CM | POA: Diagnosis not present

## 2020-01-23 DIAGNOSIS — E1140 Type 2 diabetes mellitus with diabetic neuropathy, unspecified: Secondary | ICD-10-CM | POA: Diagnosis not present

## 2020-01-23 DIAGNOSIS — E1165 Type 2 diabetes mellitus with hyperglycemia: Secondary | ICD-10-CM | POA: Diagnosis not present

## 2020-01-23 DIAGNOSIS — E785 Hyperlipidemia, unspecified: Secondary | ICD-10-CM | POA: Diagnosis not present

## 2020-01-23 DIAGNOSIS — I10 Essential (primary) hypertension: Secondary | ICD-10-CM | POA: Diagnosis not present

## 2020-02-01 ENCOUNTER — Telehealth: Payer: Self-pay | Admitting: Hematology

## 2020-02-01 NOTE — Telephone Encounter (Signed)
Rescheduled 08/06 to 08/23 due to scheduling change, patient has been called and notified.

## 2020-02-15 ENCOUNTER — Other Ambulatory Visit: Payer: PPO

## 2020-02-15 ENCOUNTER — Ambulatory Visit: Payer: PPO | Admitting: Hematology

## 2020-03-03 ENCOUNTER — Other Ambulatory Visit: Payer: Self-pay

## 2020-03-03 ENCOUNTER — Other Ambulatory Visit: Payer: Self-pay | Admitting: Hematology

## 2020-03-03 ENCOUNTER — Inpatient Hospital Stay: Payer: PPO | Attending: Hematology

## 2020-03-03 ENCOUNTER — Inpatient Hospital Stay (HOSPITAL_BASED_OUTPATIENT_CLINIC_OR_DEPARTMENT_OTHER): Payer: PPO | Admitting: Hematology

## 2020-03-03 VITALS — BP 157/71 | HR 84 | Temp 98.1°F | Resp 18 | Ht 60.0 in | Wt 176.0 lb

## 2020-03-03 DIAGNOSIS — Z794 Long term (current) use of insulin: Secondary | ICD-10-CM | POA: Insufficient documentation

## 2020-03-03 DIAGNOSIS — Z8249 Family history of ischemic heart disease and other diseases of the circulatory system: Secondary | ICD-10-CM | POA: Diagnosis not present

## 2020-03-03 DIAGNOSIS — D472 Monoclonal gammopathy: Secondary | ICD-10-CM | POA: Diagnosis not present

## 2020-03-03 DIAGNOSIS — D509 Iron deficiency anemia, unspecified: Secondary | ICD-10-CM | POA: Diagnosis not present

## 2020-03-03 DIAGNOSIS — Z79899 Other long term (current) drug therapy: Secondary | ICD-10-CM | POA: Insufficient documentation

## 2020-03-03 DIAGNOSIS — E785 Hyperlipidemia, unspecified: Secondary | ICD-10-CM | POA: Diagnosis not present

## 2020-03-03 DIAGNOSIS — D72829 Elevated white blood cell count, unspecified: Secondary | ICD-10-CM

## 2020-03-03 DIAGNOSIS — Z833 Family history of diabetes mellitus: Secondary | ICD-10-CM | POA: Diagnosis not present

## 2020-03-03 DIAGNOSIS — Z800 Family history of malignant neoplasm of digestive organs: Secondary | ICD-10-CM | POA: Insufficient documentation

## 2020-03-03 DIAGNOSIS — D72820 Lymphocytosis (symptomatic): Secondary | ICD-10-CM | POA: Insufficient documentation

## 2020-03-03 DIAGNOSIS — I10 Essential (primary) hypertension: Secondary | ICD-10-CM | POA: Diagnosis not present

## 2020-03-03 DIAGNOSIS — E119 Type 2 diabetes mellitus without complications: Secondary | ICD-10-CM | POA: Insufficient documentation

## 2020-03-03 LAB — CBC WITH DIFFERENTIAL/PLATELET
Abs Immature Granulocytes: 0.06 10*3/uL (ref 0.00–0.07)
Basophils Absolute: 0.1 10*3/uL (ref 0.0–0.1)
Basophils Relative: 1 %
Eosinophils Absolute: 0.2 10*3/uL (ref 0.0–0.5)
Eosinophils Relative: 1 %
HCT: 42.2 % (ref 36.0–46.0)
Hemoglobin: 14 g/dL (ref 12.0–15.0)
Immature Granulocytes: 0 %
Lymphocytes Relative: 29 %
Lymphs Abs: 4.1 10*3/uL — ABNORMAL HIGH (ref 0.7–4.0)
MCH: 29.9 pg (ref 26.0–34.0)
MCHC: 33.2 g/dL (ref 30.0–36.0)
MCV: 90 fL (ref 80.0–100.0)
Monocytes Absolute: 1.1 10*3/uL — ABNORMAL HIGH (ref 0.1–1.0)
Monocytes Relative: 8 %
Neutro Abs: 8.7 10*3/uL — ABNORMAL HIGH (ref 1.7–7.7)
Neutrophils Relative %: 61 %
Platelets: 302 10*3/uL (ref 150–400)
RBC: 4.69 MIL/uL (ref 3.87–5.11)
RDW: 12.9 % (ref 11.5–15.5)
WBC: 14.2 10*3/uL — ABNORMAL HIGH (ref 4.0–10.5)
nRBC: 0 % (ref 0.0–0.2)

## 2020-03-03 LAB — CMP (CANCER CENTER ONLY)
ALT: 31 U/L (ref 0–44)
AST: 24 U/L (ref 15–41)
Albumin: 3.8 g/dL (ref 3.5–5.0)
Alkaline Phosphatase: 76 U/L (ref 38–126)
Anion gap: 9 (ref 5–15)
BUN: 20 mg/dL (ref 8–23)
CO2: 27 mmol/L (ref 22–32)
Calcium: 10.4 mg/dL — ABNORMAL HIGH (ref 8.9–10.3)
Chloride: 103 mmol/L (ref 98–111)
Creatinine: 1.24 mg/dL — ABNORMAL HIGH (ref 0.44–1.00)
GFR, Est AFR Am: 51 mL/min — ABNORMAL LOW (ref >60)
GFR, Estimated: 44 mL/min — ABNORMAL LOW (ref >60)
Glucose, Bld: 182 mg/dL — ABNORMAL HIGH (ref 70–99)
Potassium: 4.3 mmol/L (ref 3.5–5.1)
Sodium: 139 mmol/L (ref 135–145)
Total Bilirubin: 0.9 mg/dL (ref 0.3–1.2)
Total Protein: 7.7 g/dL (ref 6.5–8.1)

## 2020-03-03 LAB — LACTATE DEHYDROGENASE: LDH: 179 U/L (ref 98–192)

## 2020-03-03 NOTE — Progress Notes (Signed)
HEMATOLOGY/ONCOLOGY CLINIC NOTE  Date of Service: 03/03/2020  Patient Care Team: Leighton Ruff, MD as PCP - General (Family Medicine)  CHIEF COMPLAINTS/PURPOSE OF CONSULTATION:  Leucocytosis MGUS   HISTORY OF PRESENTING ILLNESS:   Yesenia Meyers is a wonderful 69 y.o. female who has been referred to [place] by Dr ..Leighton Ruff, MD  for evaluation of leucocytosis.  Patient has a h/o DM2, IDA, HTN and notes severe rt shoulder pain and decreased ROM since Oct 2017 when she had a fall with trauma to the rt shoulder. She notes that she has been scheduled for surgery soon and is very keen to proceed with surgery.  Her CBC done with her PCP show mild leucocytosis of 12.4-14k from atleast 03/09/2016 with predominantly neutrophilia with ANC 7.7-8.9k. With borderline stable lymphocytosis with lymph # of around 3.8k and borderline monocytosis 1-1.1k.  Her hgb/hct and platelet counts are WNL.  She currently notes rt shoulder pain. No fevers/chills/night sweats/unexplained weight loss/enlarged LN or other overt new focal symptoms.  She notes that she did have a bad dental/gum infection and required antibiotics. No symptoms suggestive of another overt source of infection Denies being on steroids recently.  INTERVAL HISTORY  Yesenia Meyers is a 69 y.o. female presenting today for the evaluation and management of her leukocytosis. The patient's last visit with [place] was on 02/15/2019. The pt reports that she is doing well overall.  The pt reports that she has felt well and been stable over the last year. She has no new symptoms or concerns. Pt has received her COVID19 vaccines and is interested in the booster. Pt has has a mole her back for two years that has grown. She was scheduled to receive a Colonoscopy last year as a part of routine screening, but it was canceled. She has not yet rescheduled it.   Lab results today (03/03/20) of CBC w/diff and CMP is as follows: all values are WNL  except for WBC at 14.2K, Neutro Abs at 8.7K, Lymphs Abs at 4.1K, Mono Abs at 1.1K, Glucose at 182, Creatinine at 1.24, Calcium at 10.4, GFR Est Non Af Am at 44. 03/03/2020 LDH at 179 03/03/2020 MMP -- M spike stable @ 0.4g/dl  On review of systems, pt denies fevers, chills, night sweats, bone pain, unexpected weight loss, rash and any other symptoms.   MEDICAL HISTORY:  Past Medical History:  Diagnosis Date   Atypical chest pain    Related to anxiety    Diabetes mellitus without complication (HCC)    DOE (dyspnea on exertion)    Hyperlipemia    Hypertension   iron deficiency Anemia  SURGICAL HISTORY: Past Surgical History:  Procedure Laterality Date   BACK SURGERY  1986   Crushed 2 discs    BACK SURGERY  2004   Spinal stenosis   CARPAL TUNNEL RELEASE Bilateral    KNEE SURGERY Left    69 y.o.   PARTIAL HYSTERECTOMY N/A    Age 89   TUBAL LIGATION  1974    SOCIAL HISTORY: Social History   Socioeconomic History   Marital status: Divorced    Spouse name: Not on file   Number of children: Not on file   Years of education: Not on file   Highest education level: Not on file  Occupational History   Not on file  Tobacco Use   Smoking status: Never Smoker   Smokeless tobacco: Never Used  Vaping Use   Vaping Use: Never used  Substance and  Sexual Activity   Alcohol use: No   Drug use: No   Sexual activity: Yes  Other Topics Concern   Not on file  Social History Narrative   Not on file   Social Determinants of Health   Financial Resource Strain:    Difficulty of Paying Living Expenses: Not on file  Food Insecurity:    Worried About [place] in the Last Year: Not on file   Ran Out of Food in the Last Year: Not on file  Transportation Needs:    Lack of Transportation (Medical): Not on file   Lack of Transportation (Non-Medical): Not on file  Physical Activity:    Days of Exercise per Week: Not on file   Minutes of Exercise  per Session: Not on file  Stress:    Feeling of Stress : Not on file  Social Connections:    Frequency of Communication with Friends and Family: Not on file   Frequency of Social Gatherings with Friends and Family: Not on file   Attends Religious Services: Not on file   Active Member of Clubs or Organizations: Not on file   Attends Archivist Meetings: Not on file   Marital Status: Not on file  Intimate Partner Violence:    Fear of Current or Ex-Partner: Not on file   Emotionally Abused: Not on file   Physically Abused: Not on file   Sexually Abused: Not on file    FAMILY HISTORY: Family History  Problem Relation Age of Onset   Heart disease Mother        77's   Diabetes Mother    Hypertension Mother    Heart disease Father        39's   Diabetes Father    Hypertension Father    Diabetes Sister    Diabetes Brother    Cancer Brother        Colon   Diabetes Brother     ALLERGIES:  is allergic to celebrex [celecoxib], feldene [piroxicam], and voltaren [diclofenac sodium].  MEDICATIONS:  Current Outpatient Medications  Medication Sig Dispense Refill   atorvastatin (LIPITOR) 20 MG tablet Take 20 mg by mouth daily.     acetaminophen (TYLENOL) 500 MG tablet Take 500 mg by mouth every 6 (six) hours as needed (Pt does not take unless severe pain.).      Biotin 10000 MCG TABS Take 1,000 Units by mouth daily.     Insulin Glargine (BASAGLAR [place]PEN) 100 UNIT/ML SOPN Inject into the skin 2 (two) times daily. 24 units in the AM and 30 units in the evening     losartan-hydrochlorothiazide (HYZAAR) 100-12.5 MG tablet Take 1 tablet by mouth daily.     metFORMIN (GLUCOPHAGE) 500 MG tablet Take 500 mg by mouth 2 (two) times daily with a meal.     Multiple Vitamin (MULTI-VITAMIN DAILY PO) Take 1 tablet by mouth daily.     Omega-3 Fatty Acids (FISH OIL) 1000 MG CAPS Take 1,000 mg by mouth daily.     No current facility-administered medications for  this visit.    REVIEW OF SYSTEMS:   A 10+ POINT REVIEW OF SYSTEMS WAS OBTAINED including neurology, dermatology, psychiatry, cardiac, respiratory, lymph, extremities, GI, GU, Musculoskeletal, constitutional, breasts, reproductive, HEENT.  All pertinent positives are noted in the HPI.  All others are negative.   PHYSICAL EXAMINATION: ECOG PERFORMANCE STATUS: 1 - Symptomatic but completely ambulatory  . Vitals:   03/03/20 1519  BP: (!) 157/71  Pulse: 84  Resp: 18  Temp: 98.1 F (36.7 C)  SpO2: 100%   Filed Weights   03/03/20 1519  Weight: 176 lb (79.8 kg)   .Body mass index is 34.37 kg/m.   GENERAL:alert, in no acute distress and comfortable SKIN: no acute rashes, no significant lesions EYES: conjunctiva are pink and non-injected, sclera anicteric OROPHARYNX: MMM, no exudates, no oropharyngeal erythema or ulceration NECK: supple, no JVD LYMPH:  no palpable lymphadenopathy in the cervical, axillary or inguinal regions LUNGS: clear to auscultation b/l with normal respiratory effort HEART: regular rate & rhythm ABDOMEN:  normoactive bowel sounds , non tender, not distended. No palpable hepatosplenomegaly.  Extremity: no pedal edema PSYCH: alert & oriented x 3 with fluent speech NEURO: no focal motor/sensory deficits  LABORATORY DATA:  I have reviewed the data as listed  . CBC Latest Ref Rng & Units 03/03/2020 02/15/2019 01/06/2017  WBC 4.0 - 10.5 K/uL 14.2(H) 14.1(H) 14.6(H)  Hemoglobin 12.0 - 15.0 g/dL 14.0 14.0 15.1  Hematocrit 36 - 46 % 42.2 42.5 44.6  Platelets 150 - 400 K/uL 302 279 298   . CBC    Component Value Date/Time   WBC 14.2 (H) 03/03/2020 1424   RBC 4.69 03/03/2020 1424   HGB 14.0 03/03/2020 1424   HGB 14.0 02/15/2019 1401   HGB 15.1 01/06/2017 1611   HCT 42.2 03/03/2020 1424   HCT 44.6 01/06/2017 1611   PLT 302 03/03/2020 1424   PLT 279 02/15/2019 1401   PLT 298 01/06/2017 1611   MCV 90.0 03/03/2020 1424   MCV 90.7 01/06/2017 1611   MCH 29.9  03/03/2020 1424   MCHC 33.2 03/03/2020 1424   RDW 12.9 03/03/2020 1424   RDW 12.6 01/06/2017 1611   LYMPHSABS 4.1 (H) 03/03/2020 1424   LYMPHSABS 4.1 (H) 01/06/2017 1611   MONOABS 1.1 (H) 03/03/2020 1424   MONOABS 1.2 (H) 01/06/2017 1611   EOSABS 0.2 03/03/2020 1424   EOSABS 0.2 01/06/2017 1611   BASOSABS 0.1 03/03/2020 1424   BASOSABS 0.0 01/06/2017 1611   ANC 9k . CMP Latest Ref Rng & Units 03/03/2020 02/15/2019 01/06/2017  Glucose 70 - 99 mg/dL 182(H) 285(H) 144(H)  BUN 8 - 23 mg/dL 20 14 13.4  Creatinine 0.44 - 1.00 mg/dL 1.24(H) 1.13(H) 1.0  Sodium 135 - 145 mmol/L 139 140 142  Potassium 3.5 - 5.1 mmol/L 4.3 4.6 3.7  Chloride 98 - 111 mmol/L 103 102 -  CO2 22 - 32 mmol/L 27 26 28   Calcium 8.9 - 10.3 mg/dL 10.4(H) 9.8 10.2  Total Protein 6.5 - 8.1 g/dL 7.7 7.5 8.2  Total Bilirubin 0.3 - 1.2 mg/dL 0.9 1.0 0.88  Alkaline Phos 38 - 126 U/L 76 78 81  AST 15 - 41 U/L 24 29 27   ALT 0 - 44 U/L 31 37 31         RADIOGRAPHIC STUDIES: I have personally reviewed the radiological images as listed and agreed with the findings in the report. No results found.  ASSESSMENT & PLAN:   69 yo with incidentally noted leucocytosis on labs  1) Chronic persistent Neutrophilia  Present since at least 02/2016. No significant blasts or left shift on peripheral blood smear.  LDH WNL Sed rate WNL Appears likely reactive ? Uncontrolled rt shoulder pain. R/o inflammation in rt shoulder. Did have issues with dental /periodontal infection in march 2018. No other overt source of infection No palpable hepato-splenomegaly or LNadenopathy to suggest lymphoproliferative or myeloproliferative process.  2) Lymphocytosis - non clonal on flow cytometry  3) Mild monocytosis.  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 Likely MGUS.  PLAN -Discussed pt labwork today, 03/03/20; WBC & Hgb are stable, PLT are nml,  Calcium is elevated, other blood chemistries are steady, LDH is WNL, MMP - stable M spike @ 0.4g/dl -Leukocytosis remains steady, continues to indicate a reactive process.  -Hypercalcemia does not appear related to MGUS, more likely caused by dehydration.  -Recommended that the pt continue to eat well, drink at least 48-64 oz of water each day, and walk 20-30 minutes each day.  -Discussed the CDC guidelines regarding the COVID19 vaccine booster.  -Recommend pt f/u with GI to reschedule Colonoscopy  -Recommend pt f/u with Dermatologist for mole evaluation -Will continue yearly MMPs -Will see the pt in 12 months with labs    FOLLOW UP: RTC with Dr Irene Limbo with labs in 12 months   The total time spent in the appt was 20 minutes and more than 50% was on counseling and direct patient cares.  All of the patient's questions were answered with apparent satisfaction. The patient knows to call the clinic with any problems, questions or concerns.   Sullivan Lone MD [place] [facility] Hematology/Oncology Physician [facility]  (Office):       [phone] (Work cell):  [phone] (Fax):           [phone]  03/03/2020 3:51 PM   I, Yevette Edwards, am acting as a scribe for Dr. Sullivan Lone.   .I have reviewed the above documentation for accuracy and completeness, and I agree with the above. Brunetta Genera MD

## 2020-03-04 DIAGNOSIS — E781 Pure hyperglyceridemia: Secondary | ICD-10-CM | POA: Diagnosis not present

## 2020-03-04 DIAGNOSIS — E1140 Type 2 diabetes mellitus with diabetic neuropathy, unspecified: Secondary | ICD-10-CM | POA: Diagnosis not present

## 2020-03-04 DIAGNOSIS — E1165 Type 2 diabetes mellitus with hyperglycemia: Secondary | ICD-10-CM | POA: Diagnosis not present

## 2020-03-04 DIAGNOSIS — I10 Essential (primary) hypertension: Secondary | ICD-10-CM | POA: Diagnosis not present

## 2020-03-04 DIAGNOSIS — E785 Hyperlipidemia, unspecified: Secondary | ICD-10-CM | POA: Diagnosis not present

## 2020-03-05 LAB — MULTIPLE MYELOMA PANEL, SERUM
Albumin SerPl Elph-Mcnc: 3.6 g/dL (ref 2.9–4.4)
Albumin/Glob SerPl: 1.1 (ref 0.7–1.7)
Alpha 1: 0.2 g/dL (ref 0.0–0.4)
Alpha2 Glob SerPl Elph-Mcnc: 1 g/dL (ref 0.4–1.0)
B-Globulin SerPl Elph-Mcnc: 1.5 g/dL — ABNORMAL HIGH (ref 0.7–1.3)
Gamma Glob SerPl Elph-Mcnc: 0.7 g/dL (ref 0.4–1.8)
Globulin, Total: 3.5 g/dL (ref 2.2–3.9)
IgA: 627 mg/dL — ABNORMAL HIGH (ref 87–352)
IgG (Immunoglobin G), Serum: 781 mg/dL (ref 586–1602)
IgM (Immunoglobulin M), Srm: 137 mg/dL (ref 26–217)
M Protein SerPl Elph-Mcnc: 0.4 g/dL — ABNORMAL HIGH
Total Protein ELP: 7.1 g/dL (ref 6.0–8.5)

## 2020-03-10 DIAGNOSIS — E785 Hyperlipidemia, unspecified: Secondary | ICD-10-CM | POA: Diagnosis not present

## 2020-03-10 DIAGNOSIS — D472 Monoclonal gammopathy: Secondary | ICD-10-CM | POA: Diagnosis not present

## 2020-03-10 DIAGNOSIS — Z23 Encounter for immunization: Secondary | ICD-10-CM | POA: Diagnosis not present

## 2020-03-10 DIAGNOSIS — D72829 Elevated white blood cell count, unspecified: Secondary | ICD-10-CM | POA: Diagnosis not present

## 2020-03-10 DIAGNOSIS — E1165 Type 2 diabetes mellitus with hyperglycemia: Secondary | ICD-10-CM | POA: Diagnosis not present

## 2020-03-10 DIAGNOSIS — Z7984 Long term (current) use of oral hypoglycemic drugs: Secondary | ICD-10-CM | POA: Diagnosis not present

## 2020-03-10 DIAGNOSIS — Z0000 Encounter for general adult medical examination without abnormal findings: Secondary | ICD-10-CM | POA: Diagnosis not present

## 2020-03-10 DIAGNOSIS — I10 Essential (primary) hypertension: Secondary | ICD-10-CM | POA: Diagnosis not present

## 2020-03-14 DIAGNOSIS — E1165 Type 2 diabetes mellitus with hyperglycemia: Secondary | ICD-10-CM | POA: Diagnosis not present

## 2020-03-14 DIAGNOSIS — E785 Hyperlipidemia, unspecified: Secondary | ICD-10-CM | POA: Diagnosis not present

## 2020-03-14 DIAGNOSIS — D472 Monoclonal gammopathy: Secondary | ICD-10-CM | POA: Diagnosis not present

## 2020-03-14 DIAGNOSIS — Z0000 Encounter for general adult medical examination without abnormal findings: Secondary | ICD-10-CM | POA: Diagnosis not present

## 2020-03-14 DIAGNOSIS — I10 Essential (primary) hypertension: Secondary | ICD-10-CM | POA: Diagnosis not present

## 2020-03-14 DIAGNOSIS — D72829 Elevated white blood cell count, unspecified: Secondary | ICD-10-CM | POA: Diagnosis not present

## 2020-03-14 DIAGNOSIS — Z23 Encounter for immunization: Secondary | ICD-10-CM | POA: Diagnosis not present

## 2020-03-19 DIAGNOSIS — E1165 Type 2 diabetes mellitus with hyperglycemia: Secondary | ICD-10-CM | POA: Diagnosis not present

## 2020-03-19 DIAGNOSIS — E785 Hyperlipidemia, unspecified: Secondary | ICD-10-CM | POA: Diagnosis not present

## 2020-03-19 DIAGNOSIS — E1140 Type 2 diabetes mellitus with diabetic neuropathy, unspecified: Secondary | ICD-10-CM | POA: Diagnosis not present

## 2020-03-19 DIAGNOSIS — E781 Pure hyperglyceridemia: Secondary | ICD-10-CM | POA: Diagnosis not present

## 2020-03-19 DIAGNOSIS — I10 Essential (primary) hypertension: Secondary | ICD-10-CM | POA: Diagnosis not present

## 2020-04-24 DIAGNOSIS — E1165 Type 2 diabetes mellitus with hyperglycemia: Secondary | ICD-10-CM | POA: Diagnosis not present

## 2020-04-24 DIAGNOSIS — I10 Essential (primary) hypertension: Secondary | ICD-10-CM | POA: Diagnosis not present

## 2020-04-24 DIAGNOSIS — E1140 Type 2 diabetes mellitus with diabetic neuropathy, unspecified: Secondary | ICD-10-CM | POA: Diagnosis not present

## 2020-04-24 DIAGNOSIS — E785 Hyperlipidemia, unspecified: Secondary | ICD-10-CM | POA: Diagnosis not present

## 2020-04-24 DIAGNOSIS — E781 Pure hyperglyceridemia: Secondary | ICD-10-CM | POA: Diagnosis not present

## 2020-05-26 DIAGNOSIS — E1165 Type 2 diabetes mellitus with hyperglycemia: Secondary | ICD-10-CM | POA: Diagnosis not present

## 2020-05-26 DIAGNOSIS — E781 Pure hyperglyceridemia: Secondary | ICD-10-CM | POA: Diagnosis not present

## 2020-05-26 DIAGNOSIS — E785 Hyperlipidemia, unspecified: Secondary | ICD-10-CM | POA: Diagnosis not present

## 2020-05-26 DIAGNOSIS — E1140 Type 2 diabetes mellitus with diabetic neuropathy, unspecified: Secondary | ICD-10-CM | POA: Diagnosis not present

## 2020-05-26 DIAGNOSIS — I10 Essential (primary) hypertension: Secondary | ICD-10-CM | POA: Diagnosis not present

## 2020-06-27 DIAGNOSIS — E785 Hyperlipidemia, unspecified: Secondary | ICD-10-CM | POA: Diagnosis not present

## 2020-06-27 DIAGNOSIS — E1165 Type 2 diabetes mellitus with hyperglycemia: Secondary | ICD-10-CM | POA: Diagnosis not present

## 2020-06-27 DIAGNOSIS — I10 Essential (primary) hypertension: Secondary | ICD-10-CM | POA: Diagnosis not present

## 2020-06-30 DIAGNOSIS — E1140 Type 2 diabetes mellitus with diabetic neuropathy, unspecified: Secondary | ICD-10-CM | POA: Diagnosis not present

## 2020-06-30 DIAGNOSIS — E781 Pure hyperglyceridemia: Secondary | ICD-10-CM | POA: Diagnosis not present

## 2020-06-30 DIAGNOSIS — I10 Essential (primary) hypertension: Secondary | ICD-10-CM | POA: Diagnosis not present

## 2020-06-30 DIAGNOSIS — E1165 Type 2 diabetes mellitus with hyperglycemia: Secondary | ICD-10-CM | POA: Diagnosis not present

## 2020-06-30 DIAGNOSIS — E785 Hyperlipidemia, unspecified: Secondary | ICD-10-CM | POA: Diagnosis not present

## 2020-08-06 DIAGNOSIS — E1165 Type 2 diabetes mellitus with hyperglycemia: Secondary | ICD-10-CM | POA: Diagnosis not present

## 2020-08-06 DIAGNOSIS — E1140 Type 2 diabetes mellitus with diabetic neuropathy, unspecified: Secondary | ICD-10-CM | POA: Diagnosis not present

## 2020-08-06 DIAGNOSIS — E785 Hyperlipidemia, unspecified: Secondary | ICD-10-CM | POA: Diagnosis not present

## 2020-08-06 DIAGNOSIS — I10 Essential (primary) hypertension: Secondary | ICD-10-CM | POA: Diagnosis not present

## 2020-08-18 DIAGNOSIS — E1165 Type 2 diabetes mellitus with hyperglycemia: Secondary | ICD-10-CM | POA: Diagnosis not present

## 2020-08-18 DIAGNOSIS — I10 Essential (primary) hypertension: Secondary | ICD-10-CM | POA: Diagnosis not present

## 2020-08-22 DIAGNOSIS — E1165 Type 2 diabetes mellitus with hyperglycemia: Secondary | ICD-10-CM | POA: Diagnosis not present

## 2020-08-22 DIAGNOSIS — E785 Hyperlipidemia, unspecified: Secondary | ICD-10-CM | POA: Diagnosis not present

## 2020-08-22 DIAGNOSIS — E1140 Type 2 diabetes mellitus with diabetic neuropathy, unspecified: Secondary | ICD-10-CM | POA: Diagnosis not present

## 2020-08-22 DIAGNOSIS — I10 Essential (primary) hypertension: Secondary | ICD-10-CM | POA: Diagnosis not present

## 2020-09-09 DIAGNOSIS — E7800 Pure hypercholesterolemia, unspecified: Secondary | ICD-10-CM | POA: Diagnosis not present

## 2020-09-09 DIAGNOSIS — E1165 Type 2 diabetes mellitus with hyperglycemia: Secondary | ICD-10-CM | POA: Diagnosis not present

## 2020-09-09 DIAGNOSIS — N1831 Chronic kidney disease, stage 3a: Secondary | ICD-10-CM | POA: Diagnosis not present

## 2020-09-09 DIAGNOSIS — I10 Essential (primary) hypertension: Secondary | ICD-10-CM | POA: Diagnosis not present

## 2020-09-12 DIAGNOSIS — E785 Hyperlipidemia, unspecified: Secondary | ICD-10-CM | POA: Diagnosis not present

## 2020-09-12 DIAGNOSIS — I10 Essential (primary) hypertension: Secondary | ICD-10-CM | POA: Diagnosis not present

## 2020-09-12 DIAGNOSIS — E1165 Type 2 diabetes mellitus with hyperglycemia: Secondary | ICD-10-CM | POA: Diagnosis not present

## 2020-09-12 DIAGNOSIS — E1140 Type 2 diabetes mellitus with diabetic neuropathy, unspecified: Secondary | ICD-10-CM | POA: Diagnosis not present

## 2020-10-02 DIAGNOSIS — N764 Abscess of vulva: Secondary | ICD-10-CM | POA: Diagnosis not present

## 2020-10-15 DIAGNOSIS — I10 Essential (primary) hypertension: Secondary | ICD-10-CM | POA: Diagnosis not present

## 2020-10-15 DIAGNOSIS — E1140 Type 2 diabetes mellitus with diabetic neuropathy, unspecified: Secondary | ICD-10-CM | POA: Diagnosis not present

## 2020-10-15 DIAGNOSIS — E785 Hyperlipidemia, unspecified: Secondary | ICD-10-CM | POA: Diagnosis not present

## 2020-10-15 DIAGNOSIS — E1165 Type 2 diabetes mellitus with hyperglycemia: Secondary | ICD-10-CM | POA: Diagnosis not present

## 2020-10-27 DIAGNOSIS — M25562 Pain in left knee: Secondary | ICD-10-CM | POA: Diagnosis not present

## 2020-10-27 DIAGNOSIS — M25552 Pain in left hip: Secondary | ICD-10-CM | POA: Diagnosis not present

## 2020-11-12 DIAGNOSIS — E785 Hyperlipidemia, unspecified: Secondary | ICD-10-CM | POA: Diagnosis not present

## 2020-11-12 DIAGNOSIS — I10 Essential (primary) hypertension: Secondary | ICD-10-CM | POA: Diagnosis not present

## 2020-11-12 DIAGNOSIS — E1140 Type 2 diabetes mellitus with diabetic neuropathy, unspecified: Secondary | ICD-10-CM | POA: Diagnosis not present

## 2020-11-12 DIAGNOSIS — E1165 Type 2 diabetes mellitus with hyperglycemia: Secondary | ICD-10-CM | POA: Diagnosis not present

## 2020-12-15 DIAGNOSIS — I10 Essential (primary) hypertension: Secondary | ICD-10-CM | POA: Diagnosis not present

## 2020-12-15 DIAGNOSIS — E1140 Type 2 diabetes mellitus with diabetic neuropathy, unspecified: Secondary | ICD-10-CM | POA: Diagnosis not present

## 2020-12-15 DIAGNOSIS — E785 Hyperlipidemia, unspecified: Secondary | ICD-10-CM | POA: Diagnosis not present

## 2020-12-15 DIAGNOSIS — E1165 Type 2 diabetes mellitus with hyperglycemia: Secondary | ICD-10-CM | POA: Diagnosis not present

## 2021-01-13 DIAGNOSIS — E785 Hyperlipidemia, unspecified: Secondary | ICD-10-CM | POA: Diagnosis not present

## 2021-01-13 DIAGNOSIS — E1140 Type 2 diabetes mellitus with diabetic neuropathy, unspecified: Secondary | ICD-10-CM | POA: Diagnosis not present

## 2021-01-13 DIAGNOSIS — E1165 Type 2 diabetes mellitus with hyperglycemia: Secondary | ICD-10-CM | POA: Diagnosis not present

## 2021-01-13 DIAGNOSIS — I10 Essential (primary) hypertension: Secondary | ICD-10-CM | POA: Diagnosis not present

## 2021-02-12 DIAGNOSIS — I10 Essential (primary) hypertension: Secondary | ICD-10-CM | POA: Diagnosis not present

## 2021-02-12 DIAGNOSIS — E1165 Type 2 diabetes mellitus with hyperglycemia: Secondary | ICD-10-CM | POA: Diagnosis not present

## 2021-02-12 DIAGNOSIS — E785 Hyperlipidemia, unspecified: Secondary | ICD-10-CM | POA: Diagnosis not present

## 2021-02-12 DIAGNOSIS — E1140 Type 2 diabetes mellitus with diabetic neuropathy, unspecified: Secondary | ICD-10-CM | POA: Diagnosis not present

## 2021-03-02 ENCOUNTER — Other Ambulatory Visit: Payer: Self-pay

## 2021-03-02 DIAGNOSIS — D72829 Elevated white blood cell count, unspecified: Secondary | ICD-10-CM

## 2021-03-03 ENCOUNTER — Inpatient Hospital Stay: Payer: Medicare Other | Attending: Hematology | Admitting: Hematology

## 2021-03-03 ENCOUNTER — Inpatient Hospital Stay: Payer: Medicare Other

## 2021-03-24 DIAGNOSIS — D472 Monoclonal gammopathy: Secondary | ICD-10-CM | POA: Diagnosis not present

## 2021-03-24 DIAGNOSIS — E1140 Type 2 diabetes mellitus with diabetic neuropathy, unspecified: Secondary | ICD-10-CM | POA: Diagnosis not present

## 2021-03-24 DIAGNOSIS — Z0000 Encounter for general adult medical examination without abnormal findings: Secondary | ICD-10-CM | POA: Diagnosis not present

## 2021-03-24 DIAGNOSIS — I10 Essential (primary) hypertension: Secondary | ICD-10-CM | POA: Diagnosis not present

## 2021-03-24 DIAGNOSIS — E7800 Pure hypercholesterolemia, unspecified: Secondary | ICD-10-CM | POA: Diagnosis not present

## 2021-03-26 DIAGNOSIS — R293 Abnormal posture: Secondary | ICD-10-CM | POA: Diagnosis not present

## 2021-03-26 DIAGNOSIS — M6281 Muscle weakness (generalized): Secondary | ICD-10-CM | POA: Diagnosis not present

## 2021-03-26 DIAGNOSIS — M2560 Stiffness of unspecified joint, not elsewhere classified: Secondary | ICD-10-CM | POA: Diagnosis not present

## 2021-03-26 DIAGNOSIS — R262 Difficulty in walking, not elsewhere classified: Secondary | ICD-10-CM | POA: Diagnosis not present

## 2021-03-27 ENCOUNTER — Telehealth: Payer: Self-pay | Admitting: Hematology

## 2021-03-27 NOTE — Telephone Encounter (Signed)
Pt is an established pt of Dr. Grier Mitts. Scheduled pt for f/u, pt is aware of appt date and time.

## 2021-03-31 DIAGNOSIS — R262 Difficulty in walking, not elsewhere classified: Secondary | ICD-10-CM | POA: Diagnosis not present

## 2021-03-31 DIAGNOSIS — R293 Abnormal posture: Secondary | ICD-10-CM | POA: Diagnosis not present

## 2021-03-31 DIAGNOSIS — M6281 Muscle weakness (generalized): Secondary | ICD-10-CM | POA: Diagnosis not present

## 2021-03-31 DIAGNOSIS — M2560 Stiffness of unspecified joint, not elsewhere classified: Secondary | ICD-10-CM | POA: Diagnosis not present

## 2021-04-09 ENCOUNTER — Inpatient Hospital Stay: Payer: Medicare Other | Attending: Hematology | Admitting: Hematology

## 2021-04-09 ENCOUNTER — Inpatient Hospital Stay: Payer: Medicare Other

## 2021-04-09 ENCOUNTER — Other Ambulatory Visit: Payer: Self-pay

## 2021-04-09 VITALS — BP 167/62 | HR 95 | Temp 98.2°F | Resp 18 | Ht 60.0 in | Wt 168.5 lb

## 2021-04-09 DIAGNOSIS — E119 Type 2 diabetes mellitus without complications: Secondary | ICD-10-CM | POA: Insufficient documentation

## 2021-04-09 DIAGNOSIS — Z794 Long term (current) use of insulin: Secondary | ICD-10-CM | POA: Insufficient documentation

## 2021-04-09 DIAGNOSIS — I10 Essential (primary) hypertension: Secondary | ICD-10-CM | POA: Insufficient documentation

## 2021-04-09 DIAGNOSIS — D472 Monoclonal gammopathy: Secondary | ICD-10-CM

## 2021-04-09 DIAGNOSIS — E1165 Type 2 diabetes mellitus with hyperglycemia: Secondary | ICD-10-CM | POA: Diagnosis not present

## 2021-04-09 DIAGNOSIS — D509 Iron deficiency anemia, unspecified: Secondary | ICD-10-CM | POA: Insufficient documentation

## 2021-04-09 DIAGNOSIS — Z79899 Other long term (current) drug therapy: Secondary | ICD-10-CM | POA: Diagnosis not present

## 2021-04-09 DIAGNOSIS — D72829 Elevated white blood cell count, unspecified: Secondary | ICD-10-CM

## 2021-04-09 DIAGNOSIS — E785 Hyperlipidemia, unspecified: Secondary | ICD-10-CM | POA: Diagnosis not present

## 2021-04-09 DIAGNOSIS — Z90710 Acquired absence of both cervix and uterus: Secondary | ICD-10-CM | POA: Diagnosis not present

## 2021-04-09 DIAGNOSIS — E1140 Type 2 diabetes mellitus with diabetic neuropathy, unspecified: Secondary | ICD-10-CM | POA: Diagnosis not present

## 2021-04-09 LAB — CMP (CANCER CENTER ONLY)
ALT: 18 U/L (ref 0–44)
AST: 18 U/L (ref 15–41)
Albumin: 4.2 g/dL (ref 3.5–5.0)
Alkaline Phosphatase: 72 U/L (ref 38–126)
Anion gap: 16 — ABNORMAL HIGH (ref 5–15)
BUN: 18 mg/dL (ref 8–23)
CO2: 22 mmol/L (ref 22–32)
Calcium: 9.9 mg/dL (ref 8.9–10.3)
Chloride: 104 mmol/L (ref 98–111)
Creatinine: 1.27 mg/dL — ABNORMAL HIGH (ref 0.44–1.00)
GFR, Estimated: 45 mL/min — ABNORMAL LOW (ref >60)
Glucose, Bld: 75 mg/dL (ref 70–99)
Potassium: 4 mmol/L (ref 3.5–5.1)
Sodium: 142 mmol/L (ref 135–145)
Total Bilirubin: 0.8 mg/dL (ref 0.3–1.2)
Total Protein: 8 g/dL (ref 6.5–8.1)

## 2021-04-09 LAB — CBC WITH DIFFERENTIAL/PLATELET
Abs Immature Granulocytes: 0.05 10*3/uL (ref 0.00–0.07)
Basophils Absolute: 0.1 10*3/uL (ref 0.0–0.1)
Basophils Relative: 1 %
Eosinophils Absolute: 0.2 10*3/uL (ref 0.0–0.5)
Eosinophils Relative: 2 %
HCT: 40.3 % (ref 36.0–46.0)
Hemoglobin: 13.6 g/dL (ref 12.0–15.0)
Immature Granulocytes: 0 %
Lymphocytes Relative: 23 %
Lymphs Abs: 3 10*3/uL (ref 0.7–4.0)
MCH: 29.7 pg (ref 26.0–34.0)
MCHC: 33.7 g/dL (ref 30.0–36.0)
MCV: 88 fL (ref 80.0–100.0)
Monocytes Absolute: 1 10*3/uL (ref 0.1–1.0)
Monocytes Relative: 8 %
Neutro Abs: 8.7 10*3/uL — ABNORMAL HIGH (ref 1.7–7.7)
Neutrophils Relative %: 66 %
Platelets: 314 10*3/uL (ref 150–400)
RBC: 4.58 MIL/uL (ref 3.87–5.11)
RDW: 13.2 % (ref 11.5–15.5)
WBC: 13.1 10*3/uL — ABNORMAL HIGH (ref 4.0–10.5)
nRBC: 0 % (ref 0.0–0.2)

## 2021-04-09 LAB — LACTATE DEHYDROGENASE: LDH: 163 U/L (ref 98–192)

## 2021-04-09 LAB — VITAMIN D 25 HYDROXY (VIT D DEFICIENCY, FRACTURES): Vit D, 25-Hydroxy: 29.23 ng/mL — ABNORMAL LOW (ref 30–100)

## 2021-04-10 LAB — KAPPA/LAMBDA LIGHT CHAINS
Kappa free light chain: 70.4 mg/L — ABNORMAL HIGH (ref 3.3–19.4)
Kappa, lambda light chain ratio: 3.43 — ABNORMAL HIGH (ref 0.26–1.65)
Lambda free light chains: 20.5 mg/L (ref 5.7–26.3)

## 2021-04-13 LAB — MULTIPLE MYELOMA PANEL, SERUM
Albumin SerPl Elph-Mcnc: 4 g/dL (ref 2.9–4.4)
Albumin/Glob SerPl: 1.3 (ref 0.7–1.7)
Alpha 1: 0.2 g/dL (ref 0.0–0.4)
Alpha2 Glob SerPl Elph-Mcnc: 1 g/dL (ref 0.4–1.0)
B-Globulin SerPl Elph-Mcnc: 1.4 g/dL — ABNORMAL HIGH (ref 0.7–1.3)
Gamma Glob SerPl Elph-Mcnc: 0.7 g/dL (ref 0.4–1.8)
Globulin, Total: 3.3 g/dL (ref 2.2–3.9)
IgA: 583 mg/dL — ABNORMAL HIGH (ref 64–422)
IgG (Immunoglobin G), Serum: 803 mg/dL (ref 586–1602)
IgM (Immunoglobulin M), Srm: 127 mg/dL (ref 26–217)
M Protein SerPl Elph-Mcnc: 0.3 g/dL — ABNORMAL HIGH
Total Protein ELP: 7.3 g/dL (ref 6.0–8.5)

## 2021-04-16 NOTE — Progress Notes (Signed)
HEMATOLOGY/ONCOLOGY CLINIC NOTE  Date of Service: 04/16/2021  Patient Care Team: Leighton Ruff, MD as PCP - General (Family Medicine)  CHIEF COMPLAINTS/PURPOSE OF CONSULTATION:  Leucocytosis MGUS   HISTORY OF PRESENTING ILLNESS:   Yesenia Meyers is a wonderful 70 y.o. female who has been referred to [place] by Dr ..Leighton Ruff, MD  for evaluation of leucocytosis.  Patient has a h/o DM2, IDA, HTN and notes severe rt shoulder pain and decreased ROM since Oct 2017 when she had a fall with trauma to the rt shoulder. She notes that she has been scheduled for surgery soon and is very keen to proceed with surgery.  Her CBC done with her PCP show mild leucocytosis of 12.4-14k from atleast 03/09/2016 with predominantly neutrophilia with ANC 7.7-8.9k. With borderline stable lymphocytosis with lymph # of around 3.8k and borderline monocytosis 1-1.1k.  Her hgb/hct and platelet counts are WNL.  She currently notes rt shoulder pain. No fevers/chills/night sweats/unexplained weight loss/enlarged LN or other overt new focal symptoms.  She notes that she did have a bad dental/gum infection and required antibiotics. No symptoms suggestive of another overt source of infection Denies being on steroids recently.  INTERVAL HISTORY  Yesenia Meyers is a 70 y.o. 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  Patient notes she has been doing well and notes no acute weight loss.  No acute new focal bone pains.  No new fatigue.  No other acute change in her overall health.  Lab results today 04/09/2021 show CBC w/diff chronic leukocytosis with a WBC count of 13.1k with neutrophilia ANC 8.7k CMP is unremarkable other than a borderline elevated creatinine of 1.27. Myeloma panel shows a stable M protein of 0.3 g/dL which is unchanged from her previous lab of 0.4 g/dL of IgG  kappa monoclonal paraprotein.  MEDICAL HISTORY:  Past Medical History:  Diagnosis Date   Atypical chest pain    Related to anxiety    Diabetes mellitus without complication (HCC)    DOE (dyspnea on exertion)    Hyperlipemia    Hypertension   iron deficiency Anemia  SURGICAL HISTORY: Past Surgical History:  Procedure Laterality Date   BACK SURGERY  1986   Crushed 2 discs    BACK SURGERY  2004   Spinal stenosis   CARPAL TUNNEL RELEASE Bilateral    KNEE SURGERY Left    70 y.o.   PARTIAL HYSTERECTOMY N/A    Age 54   TUBAL LIGATION  1974    SOCIAL HISTORY: Social History   Socioeconomic History   Marital status: Divorced    Spouse name: Not on file   Number of children: Not on file   Years of education: Not on file   Highest education level: Not on file  Occupational History   Not on file  Tobacco Use   Smoking status: Never   Smokeless tobacco: Never  Vaping Use   Vaping Use: Never used  Substance and Sexual Activity   Alcohol use: No   Drug use: No   Sexual activity: Yes  Other Topics Concern   Not on file  Social History Narrative   Not on file   Social Determinants of Health   Financial Resource Strain: Not on file  Food Insecurity: Not on file  Transportation Needs: Not on file  Physical Activity: Not on file  Stress: Not on file  Social Connections: Not on file  Intimate Partner Violence: Not on file    FAMILY HISTORY: Family History  Problem Relation Age of Onset   Heart disease Mother        67's   Diabetes Mother    Hypertension Mother    Heart disease Father        29's   Diabetes Father    Hypertension Father    Diabetes Sister    Diabetes Brother    Cancer Brother        Colon   Diabetes Brother     ALLERGIES:  is allergic to celebrex [celecoxib], feldene [piroxicam], and voltaren [diclofenac sodium].  MEDICATIONS:  Current Outpatient Medications  Medication Sig Dispense Refill   Dulaglutide (TRULICITY [place]) Inject 0.5 Units  into the skin once a week.     acetaminophen (TYLENOL) 500 MG tablet Take 500 mg by mouth every 6 (six) hours as needed (Pt does not take unless severe pain.).      atorvastatin (LIPITOR) 20 MG tablet Take 20 mg by mouth daily.     Biotin 10000 MCG TABS Take 1,000 Units by mouth daily.     Insulin Glargine (BASAGLAR KWIKPEN) 100 UNIT/ML SOPN Inject into the skin 2 (two) times daily. 24 units in the AM and 30 units in the evening     losartan-hydrochlorothiazide (HYZAAR) 100-12.5 MG tablet Take 1 tablet by mouth daily.     metFORMIN (GLUCOPHAGE) 500 MG tablet Take 500 mg by mouth 2 (two) times daily with a meal.     Multiple Vitamin (MULTI-VITAMIN DAILY PO) Take 1 tablet by mouth daily.     Omega-3 Fatty Acids (FISH OIL) 1000 MG CAPS Take 1,000 mg by mouth daily.     No current facility-administered medications for this visit.    REVIEW OF SYSTEMS:   .10 Point review of Systems was done is negative except as noted above.  PHYSICAL EXAMINATION: ECOG PERFORMANCE STATUS: 1 - Symptomatic but completely ambulatory  . Vitals:   04/09/21 0940  BP: (!) 167/62  Pulse: 95  Resp: 18  Temp: 98.2 F (36.8 C)  SpO2: 98%   Filed Weights   04/09/21 0940  Weight: 168 lb 8 oz (76.4 kg)   .Body mass index is 32.91 kg/m.   .10 Point review of Systems was done is negative except as noted above.  LABORATORY DATA:  I have reviewed the data as listed  . CBC Latest Ref Rng & Units 04/09/2021 03/03/2020 02/15/2019  WBC 4.0 - 10.5 K/uL 13.1(H) 14.2(H) 14.1(H)  Hemoglobin 12.0 - 15.0 g/dL 13.6 14.0 14.0  Hematocrit 36.0 - 46.0 % 40.3 42.2 42.5  Platelets 150 - 400 K/uL 314 302 279   . CBC    Component Value Date/Time   WBC 13.1 (H) 04/09/2021 1056   RBC 4.58 04/09/2021 1056   HGB 13.6 04/09/2021 1056   HGB 14.0 02/15/2019 1401   HGB 15.1 01/06/2017 1611   HCT 40.3 04/09/2021 1056   HCT 44.6 01/06/2017 1611   PLT 314 04/09/2021 1056   PLT 279 02/15/2019 1401   PLT 298 01/06/2017 1611    MCV 88.0 04/09/2021 1056   MCV 90.7 01/06/2017 1611   MCH 29.7 04/09/2021 1056   MCHC 33.7 04/09/2021 1056   RDW 13.2 04/09/2021 1056   RDW 12.6 01/06/2017 1611   LYMPHSABS 3.0 04/09/2021 1056   LYMPHSABS 4.1 (H) 01/06/2017 1611   MONOABS 1.0 04/09/2021 1056   MONOABS 1.2 (H)  01/06/2017 1611   EOSABS 0.2 04/09/2021 1056   EOSABS 0.2 01/06/2017 1611   BASOSABS 0.1 04/09/2021 1056   BASOSABS 0.0 01/06/2017 1611   ANC 9k . CMP Latest Ref Rng & Units 04/09/2021 03/03/2020 02/15/2019  Glucose 70 - 99 mg/dL 75 182(H) 285(H)  BUN 8 - 23 mg/dL 18 20 14   Creatinine 0.44 - 1.00 mg/dL 1.27(H) 1.24(H) 1.13(H)  Sodium 135 - 145 mmol/L 142 139 140  Potassium 3.5 - 5.1 mmol/L 4.0 4.3 4.6  Chloride 98 - 111 mmol/L 104 103 102  CO2 22 - 32 mmol/L 22 27 26   Calcium 8.9 - 10.3 mg/dL 9.9 10.4(H) 9.8  Total Protein 6.5 - 8.1 g/dL 8.0 7.7 7.5  Total Bilirubin 0.3 - 1.2 mg/dL 0.8 0.9 1.0  Alkaline Phos 38 - 126 U/L 72 76 78  AST 15 - 41 U/L 18 24 29   ALT 0 - 44 U/L 18 31 37         RADIOGRAPHIC STUDIES: I have personally reviewed the radiological images as listed and agreed with the findings in the report. No results found.  ASSESSMENT & PLAN:   70 yo with incidentally noted leucocytosis on labs  1) Chronic persistent Neutrophilia  Present since at least 02/2016. No significant blasts or left shift on peripheral blood smear.  LDH WNL Sed rate WNL Appears likely reactive ? Uncontrolled rt shoulder pain. R/o inflammation in rt shoulder. Did have issues with dental /periodontal infection in march 2018. No other overt source of infection No palpable hepato-splenomegaly or LNadenopathy to suggest lymphoproliferative or myeloproliferative process.  2) Lymphocytosis - non clonal on flow cytometry  3) Mild monocytosis.  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 Likely MGUS.  PLAN -Discussed pt labwork  today, 04/09/2021 Lab results today 04/09/2021 show CBC w/diff chronic leukocytosis with a WBC count of 13.1k with neutrophilia ANC 8.7k CMP is unremarkable other than a borderline elevated creatinine of 1.27. Myeloma panel shows a stable M protein of 0.3 g/dL which is unchanged from her previous lab of 0.4 g/dL of IgG kappa monoclonal paraprotein. -Leukocytosis remains steady, continues to indicate a reactive process.  -Will see the pt in 12 months with labs    FOLLOW UP: Labs today RTC with Dr Irene Limbo with labs in 12 months  . The total time spent in the appointment was 20 minutes and more than 50% was on counseling and direct patient cares.   All of the patient's questions were answered with apparent satisfaction. The patient knows to call the clinic with any problems, questions or concerns.   Sullivan Lone MD [place] [facility] Hematology/Oncology Physician [facility]

## 2021-06-24 ENCOUNTER — Emergency Department (HOSPITAL_BASED_OUTPATIENT_CLINIC_OR_DEPARTMENT_OTHER): Payer: Medicare Other

## 2021-06-24 ENCOUNTER — Observation Stay (HOSPITAL_BASED_OUTPATIENT_CLINIC_OR_DEPARTMENT_OTHER)
Admission: EM | Admit: 2021-06-24 | Discharge: 2021-06-27 | Disposition: A | Discharge status: Home / Self Care | Payer: Medicare Other | Attending: Internal Medicine | Admitting: Internal Medicine

## 2021-06-24 ENCOUNTER — Other Ambulatory Visit: Payer: Self-pay

## 2021-06-24 ENCOUNTER — Encounter (HOSPITAL_BASED_OUTPATIENT_CLINIC_OR_DEPARTMENT_OTHER): Payer: Self-pay | Admitting: Emergency Medicine

## 2021-06-24 DIAGNOSIS — J09X9 Influenza due to identified novel influenza A virus with other manifestations: Secondary | ICD-10-CM | POA: Insufficient documentation

## 2021-06-24 DIAGNOSIS — R531 Weakness: Principal | ICD-10-CM | POA: Insufficient documentation

## 2021-06-24 DIAGNOSIS — E785 Hyperlipidemia, unspecified: Secondary | ICD-10-CM | POA: Diagnosis present

## 2021-06-24 DIAGNOSIS — R2681 Unsteadiness on feet: Secondary | ICD-10-CM

## 2021-06-24 DIAGNOSIS — Z794 Long term (current) use of insulin: Secondary | ICD-10-CM | POA: Insufficient documentation

## 2021-06-24 DIAGNOSIS — Z79899 Other long term (current) drug therapy: Secondary | ICD-10-CM | POA: Insufficient documentation

## 2021-06-24 DIAGNOSIS — J101 Influenza due to other identified influenza virus with other respiratory manifestations: Secondary | ICD-10-CM | POA: Diagnosis present

## 2021-06-24 DIAGNOSIS — I10 Essential (primary) hypertension: Secondary | ICD-10-CM | POA: Insufficient documentation

## 2021-06-24 DIAGNOSIS — E119 Type 2 diabetes mellitus without complications: Secondary | ICD-10-CM | POA: Insufficient documentation

## 2021-06-24 DIAGNOSIS — R2689 Other abnormalities of gait and mobility: Secondary | ICD-10-CM | POA: Diagnosis not present

## 2021-06-24 DIAGNOSIS — Z20822 Contact with and (suspected) exposure to covid-19: Secondary | ICD-10-CM | POA: Insufficient documentation

## 2021-06-24 LAB — BASIC METABOLIC PANEL
Anion gap: 10 (ref 5–15)
BUN: 20 mg/dL (ref 8–23)
CO2: 27 mmol/L (ref 22–32)
Calcium: 9.2 mg/dL (ref 8.9–10.3)
Chloride: 101 mmol/L (ref 98–111)
Creatinine, Ser: 1.3 mg/dL — ABNORMAL HIGH (ref 0.44–1.00)
GFR, Estimated: 44 mL/min — ABNORMAL LOW (ref >60)
Glucose, Bld: 103 mg/dL — ABNORMAL HIGH (ref 70–99)
Potassium: 3.8 mmol/L (ref 3.5–5.1)
Sodium: 138 mmol/L (ref 135–145)

## 2021-06-24 LAB — CBC WITH DIFFERENTIAL/PLATELET
Abs Immature Granulocytes: 0.04 10*3/uL (ref 0.00–0.07)
Basophils Absolute: 0 10*3/uL (ref 0.0–0.1)
Basophils Relative: 0 %
Eosinophils Absolute: 0 10*3/uL (ref 0.0–0.5)
Eosinophils Relative: 0 %
HCT: 39.3 % (ref 36.0–46.0)
Hemoglobin: 12.9 g/dL (ref 12.0–15.0)
Immature Granulocytes: 0 %
Lymphocytes Relative: 12 %
Lymphs Abs: 1.5 10*3/uL (ref 0.7–4.0)
MCH: 29.1 pg (ref 26.0–34.0)
MCHC: 32.8 g/dL (ref 30.0–36.0)
MCV: 88.7 fL (ref 80.0–100.0)
Monocytes Absolute: 1.9 10*3/uL — ABNORMAL HIGH (ref 0.1–1.0)
Monocytes Relative: 16 %
Neutro Abs: 8.6 10*3/uL — ABNORMAL HIGH (ref 1.7–7.7)
Neutrophils Relative %: 72 %
Platelets: 253 10*3/uL (ref 150–400)
RBC: 4.43 MIL/uL (ref 3.87–5.11)
RDW: 13.1 % (ref 11.5–15.5)
WBC: 12 10*3/uL — ABNORMAL HIGH (ref 4.0–10.5)
nRBC: 0 % (ref 0.0–0.2)

## 2021-06-24 LAB — URINALYSIS, ROUTINE W REFLEX MICROSCOPIC
Bilirubin Urine: NEGATIVE
Glucose, UA: NEGATIVE mg/dL
Hgb urine dipstick: NEGATIVE
Ketones, ur: NEGATIVE mg/dL
Leukocytes,Ua: NEGATIVE
Nitrite: NEGATIVE
Protein, ur: NEGATIVE mg/dL
Specific Gravity, Urine: 1.012 (ref 1.005–1.030)
pH: 5.5 (ref 5.0–8.0)

## 2021-06-24 LAB — RESP PANEL BY RT-PCR (FLU A&B, COVID) ARPGX2
Influenza A by PCR: POSITIVE — AB
Influenza B by PCR: NEGATIVE
SARS Coronavirus 2 by RT PCR: NEGATIVE

## 2021-06-24 MED ORDER — MECLIZINE HCL 25 MG PO TABS
25.0000 mg | ORAL_TABLET | Freq: Once | ORAL | Status: AC
  Administered 2021-06-25: 25 mg via ORAL
  Filled 2021-06-24: qty 1

## 2021-06-24 NOTE — ED Provider Notes (Signed)
[facility] EMERGENCY DEPT Provider Note   CSN: [id number] Arrival date & time: 06/24/21  1907     History Chief Complaint  Patient presents with   Weakness    Yesenia Meyers is a 70 y.o. female.  Patient presents ER chief complaint of generalized weakness, unsteady gait.  She states that she is feeling under the weather for the past week but otherwise at her normal strength.  She had her COVID booster and flu shot, she was resting on her couch today.  Afterwards when she try to get up she felt too weak to stand.  She felt like she could not stand without assistance.  She rolled onto the ground and try to crawl to the door.  Her sister found her on the ground and brought to the hospital.  Patient states that she slid off and did not fall.  Denies headache or neck pain or chest pain or back pain.  No reports of fevers or cough or vomiting or diarrhea.      Past Medical History:  Diagnosis Date   Atypical chest pain    Related to anxiety    Diabetes mellitus without complication (HCC)    DOE (dyspnea on exertion)    Hyperlipemia    Hypertension     There are no problems to display for this patient.   Past Surgical History:  Procedure Laterality Date   BACK SURGERY  1986   Crushed 2 discs    BACK SURGERY  2004   Spinal stenosis   CARPAL TUNNEL RELEASE Bilateral    KNEE SURGERY Left    70 y.o.   PARTIAL HYSTERECTOMY N/A    Age 45   TUBAL LIGATION  1974     OB History   No obstetric history on file.     Family History  Problem Relation Age of Onset   Heart disease Mother        31's   Diabetes Mother    Hypertension Mother    Heart disease Father        72's   Diabetes Father    Hypertension Father    Diabetes Sister    Diabetes Brother    Cancer Brother        Colon   Diabetes Brother     Social History   Tobacco Use   Smoking status: Never   Smokeless tobacco: Never  Vaping Use   Vaping Use: Never used  Substance Use Topics    Alcohol use: No   Drug use: No    Home Medications Prior to Admission medications   Medication Sig Start Date End Date Taking? Authorizing Provider  acetaminophen (TYLENOL) 500 MG tablet Take 500 mg by mouth every 6 (six) hours as needed (Pt does not take unless severe pain.).     [provider]  atorvastatin (LIPITOR) 20 MG tablet Take 20 mg by mouth daily.    [provider]  Biotin 10000 MCG TABS Take 1,000 Units by mouth daily.    [provider]  Dulaglutide (TRULICITY [place]) Inject 0.5 Units into the skin once a week.    [provider]  Insulin Glargine (BASAGLAR KWIKPEN) 100 UNIT/ML SOPN Inject into the skin 2 (two) times daily. 24 units in the AM and 30 units in the evening    [provider]  losartan-hydrochlorothiazide (HYZAAR) 100-12.5 MG tablet Take 1 tablet by mouth daily.    [provider]  metFORMIN (GLUCOPHAGE) 500 MG tablet Take 500  mg by mouth 2 (two) times daily with a meal.    [provider]  Multiple Vitamin (MULTI-VITAMIN DAILY PO) Take 1 tablet by mouth daily.    [provider]  Omega-3 Fatty Acids (FISH OIL) 1000 MG CAPS Take 1,000 mg by mouth daily.    [provider]    Allergies    Celebrex [celecoxib], Feldene [piroxicam], and Voltaren [diclofenac sodium]  Review of Systems   Review of Systems  Constitutional:  Negative for fever.  HENT:  Negative for ear pain.   Eyes:  Negative for pain.  Respiratory:  Negative for cough.   Cardiovascular:  Negative for chest pain.  Gastrointestinal:  Negative for abdominal pain.  Genitourinary:  Negative for flank pain.  Musculoskeletal:  Negative for back pain.  Skin:  Negative for rash.  Neurological:  Negative for headaches.   Physical Exam Updated Vital Signs BP (!) 145/77    Pulse 92    Temp 98.2 F (36.8 C) (Oral)    Resp (!) 22    SpO2 95%   Physical Exam Constitutional:      General: She is not in acute distress.     Appearance: Normal appearance.  HENT:     Head: Normocephalic.     Nose: Nose normal.  Eyes:     Extraocular Movements: Extraocular movements intact.  Cardiovascular:     Rate and Rhythm: Normal rate.  Pulmonary:     Effort: Pulmonary effort is normal.  Musculoskeletal:        General: Normal range of motion.     Cervical back: Normal range of motion.  Neurological:     General: No focal deficit present.     Mental Status: She is alert and oriented to person, place, and time. Mental status is at baseline.     Cranial Nerves: No cranial nerve deficit.     Motor: No weakness.     Comments: 5/5 strength bilateral upper and lower extremities.  Cranial nerves II to XII intact. Patient has a persistent unsteady gait.    ED Results / Procedures / Treatments   Labs (all labs ordered are listed, but only abnormal results are displayed) Labs Reviewed  RESP PANEL BY RT-PCR (FLU A&B, COVID) ARPGX2 - Abnormal; Notable for the following components:      Result Value   Influenza A by PCR POSITIVE (*)    All other components within normal limits  CBC WITH DIFFERENTIAL/PLATELET - Abnormal; Notable for the following components:   WBC 12.0 (*)    Neutro Abs 8.6 (*)    Monocytes Absolute 1.9 (*)    All other components within normal limits  BASIC METABOLIC PANEL - Abnormal; Notable for the following components:   Glucose, Bld 103 (*)    Creatinine, Ser 1.30 (*)    GFR, Estimated 44 (*)    All other components within normal limits  URINALYSIS, ROUTINE W REFLEX MICROSCOPIC    EKG EKG Interpretation  Date/Time:  Wednesday June 24 2021 21:09:26 EST Ventricular Rate:  93 PR Interval:  161 QRS Duration: 93 QT Interval:  364 QTC Calculation: 453 R Axis:   -67 Text Interpretation: Sinus rhythm Left anterior fascicular block Low voltage, precordial leads Consider anterior infarct Confirmed by Thamas Jaegers ([id number]) on 06/24/2021 10:38:05 PM  Radiology CT Head Wo Contrast  Result Date:  06/24/2021 CLINICAL DATA:  Head trauma. EXAM: CT HEAD WITHOUT CONTRAST TECHNIQUE: Contiguous axial images were obtained from the base of the skull through the vertex without  intravenous contrast. COMPARISON:  None. FINDINGS: Brain: Mild age-related atrophy and moderate chronic microvascular ischemic changes. There is no acute intracranial hemorrhage. No mass effect or midline shift no extra-axial fluid collection. Vascular: No hyperdense vessel or unexpected calcification. Skull: Normal. Negative for fracture or focal lesion. Sinuses/Orbits: No acute finding. Other: None IMPRESSION: 1. No acute intracranial pathology. 2. Mild age-related atrophy and chronic microvascular ischemic changes. Electronically Signed   By: Anner Crete M.D.   On: 06/24/2021 21:57    Procedures Procedures   Medications Ordered in ED Medications  meclizine (ANTIVERT) tablet 25 mg (has no administration in time range)    ED Course  I have reviewed the triage vital signs and the nursing notes.  Pertinent labs & imaging results that were available during my care of the patient were reviewed by me and considered in my medical decision making (see chart for details).    MDM Rules/Calculators/A&P                            Patient still having difficulty walking without assistance which is new for her.  CT imaging of the head is unremarkable.  Labs have been ordered and unremarkable patient is influenza A positive unclear this is the cause for her weakness or not.  Stroke was considered but she has no unilateral weakness and she is able to raise her legs off the bed 5/5 strength.  She denies any dizziness sensation other than weakness of her lower extremities.  Patient admitted to the hospitalist team.   Final Clinical Impression(s) / ED Diagnoses Final diagnoses:  Unsteady gait    Rx / DC Orders ED Discharge Orders     None        Luna Fuse, MD 06/25/21 [phone]

## 2021-06-24 NOTE — ED Triage Notes (Signed)
Generalized weakness and frequent falls this weeks. Reports hitting her head. Concerned for inner ear problem.

## 2021-06-25 ENCOUNTER — Encounter (HOSPITAL_COMMUNITY): Payer: Self-pay | Admitting: Internal Medicine

## 2021-06-25 DIAGNOSIS — I10 Essential (primary) hypertension: Secondary | ICD-10-CM | POA: Diagnosis present

## 2021-06-25 DIAGNOSIS — Z20822 Contact with and (suspected) exposure to covid-19: Secondary | ICD-10-CM | POA: Diagnosis not present

## 2021-06-25 DIAGNOSIS — R531 Weakness: Secondary | ICD-10-CM

## 2021-06-25 DIAGNOSIS — J09X9 Influenza due to identified novel influenza A virus with other manifestations: Secondary | ICD-10-CM | POA: Diagnosis not present

## 2021-06-25 DIAGNOSIS — E119 Type 2 diabetes mellitus without complications: Secondary | ICD-10-CM | POA: Diagnosis not present

## 2021-06-25 DIAGNOSIS — Z794 Long term (current) use of insulin: Secondary | ICD-10-CM | POA: Diagnosis not present

## 2021-06-25 DIAGNOSIS — J101 Influenza due to other identified influenza virus with other respiratory manifestations: Secondary | ICD-10-CM | POA: Diagnosis present

## 2021-06-25 DIAGNOSIS — E785 Hyperlipidemia, unspecified: Secondary | ICD-10-CM | POA: Diagnosis present

## 2021-06-25 DIAGNOSIS — Z79899 Other long term (current) drug therapy: Secondary | ICD-10-CM | POA: Diagnosis not present

## 2021-06-25 LAB — GLUCOSE, CAPILLARY
Glucose-Capillary: 163 mg/dL — ABNORMAL HIGH (ref 70–99)
Glucose-Capillary: 128 mg/dL — ABNORMAL HIGH (ref 70–99)
Glucose-Capillary: 152 mg/dL — ABNORMAL HIGH (ref 70–99)

## 2021-06-25 MED ORDER — METFORMIN HCL 500 MG PO TABS
500.0000 mg | ORAL_TABLET | Freq: Two times a day (BID) | ORAL | Status: DC
  Administered 2021-06-25 – 2021-06-27 (×5): 500 mg via ORAL
  Filled 2021-06-25 (×5): qty 1

## 2021-06-25 MED ORDER — ONDANSETRON HCL 4 MG/2ML IJ SOLN: 4.0000 mg | Freq: Four times a day (QID) | INTRAMUSCULAR | Status: DC | PRN

## 2021-06-25 MED ORDER — ONDANSETRON HCL 4 MG PO TABS: 4.0000 mg | ORAL_TABLET | Freq: Four times a day (QID) | ORAL | Status: DC | PRN

## 2021-06-25 MED ORDER — LACTATED RINGERS IV SOLN: INTRAVENOUS | Status: AC

## 2021-06-25 MED ORDER — HYDROCHLOROTHIAZIDE 12.5 MG PO TABS
12.5000 mg | ORAL_TABLET | Freq: Every day | ORAL | Status: DC
  Administered 2021-06-25: 12.5 mg via ORAL
  Filled 2021-06-25: qty 1

## 2021-06-25 MED ORDER — ATORVASTATIN CALCIUM 10 MG PO TABS
20.0000 mg | ORAL_TABLET | Freq: Every day | ORAL | Status: DC
  Administered 2021-06-25 – 2021-06-27 (×3): 20 mg via ORAL
  Filled 2021-06-25 (×3): qty 2

## 2021-06-25 MED ORDER — OSELTAMIVIR PHOSPHATE 30 MG PO CAPS
30.0000 mg | ORAL_CAPSULE | Freq: Two times a day (BID) | ORAL | Status: DC
  Administered 2021-06-25 – 2021-06-27 (×4): 30 mg via ORAL
  Filled 2021-06-25 (×6): qty 1

## 2021-06-25 MED ORDER — LOSARTAN POTASSIUM-HCTZ 100-12.5 MG PO TABS: 1.0000 | ORAL_TABLET | Freq: Every day | ORAL | Status: DC

## 2021-06-25 MED ORDER — BASAGLAR KWIKPEN 100 UNIT/ML ~~LOC~~ SOPN: 24.0000 [IU] | PEN_INJECTOR | Freq: Two times a day (BID) | SUBCUTANEOUS | Status: DC

## 2021-06-25 MED ORDER — INSULIN GLARGINE-YFGN 100 UNIT/ML ~~LOC~~ SOLN
24.0000 [IU] | Freq: Every day | SUBCUTANEOUS | Status: DC
  Administered 2021-06-26: 24 [IU] via SUBCUTANEOUS
  Filled 2021-06-25: qty 0.24

## 2021-06-25 MED ORDER — INSULIN GLARGINE-YFGN 100 UNIT/ML ~~LOC~~ SOLN
30.0000 [IU] | Freq: Every day | SUBCUTANEOUS | Status: DC
  Administered 2021-06-25: 30 [IU] via SUBCUTANEOUS
  Filled 2021-06-25 (×2): qty 0.3

## 2021-06-25 MED ORDER — OSELTAMIVIR PHOSPHATE 75 MG PO CAPS: 75.0000 mg | ORAL_CAPSULE | Freq: Two times a day (BID) | ORAL | Status: DC

## 2021-06-25 MED ORDER — ACETAMINOPHEN 650 MG RE SUPP: 650.0000 mg | Freq: Four times a day (QID) | RECTAL | Status: DC | PRN

## 2021-06-25 MED ORDER — MENTHOL 3 MG MT LOZG
1.0000 | LOZENGE | Freq: Once | OROMUCOSAL | Status: AC
  Administered 2021-06-25: 3 mg via ORAL
  Filled 2021-06-25: qty 9

## 2021-06-25 MED ORDER — ENOXAPARIN SODIUM 40 MG/0.4ML IJ SOSY
40.0000 mg | PREFILLED_SYRINGE | INTRAMUSCULAR | Status: DC
  Administered 2021-06-25 – 2021-06-26 (×2): 40 mg via SUBCUTANEOUS
  Filled 2021-06-25 (×2): qty 0.4

## 2021-06-25 MED ORDER — OSELTAMIVIR PHOSPHATE 75 MG PO CAPS
75.0000 mg | ORAL_CAPSULE | Freq: Once | ORAL | Status: AC
  Administered 2021-06-25: 75 mg via ORAL
  Filled 2021-06-25: qty 1

## 2021-06-25 MED ORDER — AMLODIPINE BESYLATE 5 MG PO TABS
5.0000 mg | ORAL_TABLET | Freq: Every day | ORAL | Status: DC
  Administered 2021-06-26 – 2021-06-27 (×3): 5 mg via ORAL
  Filled 2021-06-25 (×3): qty 1

## 2021-06-25 MED ORDER — ACETAMINOPHEN 325 MG PO TABS
650.0000 mg | ORAL_TABLET | Freq: Four times a day (QID) | ORAL | Status: DC | PRN
  Administered 2021-06-25: 650 mg via ORAL
  Filled 2021-06-25: qty 2

## 2021-06-25 MED ORDER — LOSARTAN POTASSIUM 50 MG PO TABS
100.0000 mg | ORAL_TABLET | Freq: Every day | ORAL | Status: DC
  Administered 2021-06-25 – 2021-06-27 (×3): 100 mg via ORAL
  Filled 2021-06-25 (×3): qty 2

## 2021-06-25 MED ORDER — ORAL CARE MOUTH RINSE
15.0000 mL | Freq: Two times a day (BID) | OROMUCOSAL | Status: DC
  Administered 2021-06-26 – 2021-06-27 (×3): 15 mL via OROMUCOSAL

## 2021-06-25 MED ORDER — MENTHOL 3 MG MT LOZG
1.0000 | LOZENGE | OROMUCOSAL | Status: DC | PRN
  Administered 2021-06-25: 3 mg via ORAL
  Filled 2021-06-25: qty 9

## 2021-06-25 NOTE — ED Notes (Signed)
Handoff report given to [facility] 

## 2021-06-25 NOTE — ED Notes (Signed)
[facility] at bedside 

## 2021-06-25 NOTE — ED Notes (Signed)
Handoff report given to floor nurse 5E at [facility]

## 2021-06-25 NOTE — H&P (Signed)
History and Physical    Yesenia Meyers AQT:[id number] DOB: 05/22/1951 DOA: 06/24/2021  PCP: Leighton Ruff, MD (Inactive)  Patient coming from: Home.  I have personally briefly reviewed patient's old medical records in [place]  Chief Complaint: Generalized weakness.  HPI: Yesenia Meyers is a 70 y.o. female with medical history significant of atypical chest pain, anxiety, depression, type II DM, hyperlipidemia, hypertension who presented to the emergency department with complaints of generalized weakness and frequent falls in the past week.  Yesterday, she fell and hit her head.  She got to her couch and then fell like she was unable to get up.  She did call her sister, rolled out of the couch and then crawled to the door to unlock it so they could get in.  She was recently vaccinated with influenza and COVID-19.  She also feels that the recent cold weather was a factor as well.  She denied fever, night sweats but has had chills.  No headache, but had mild rhinorrhea and has a sore throat at the moment with occasional dry cough.  No dyspnea, chest pain, palpitations, diaphoresis, PND, orthopnea or recent pitting edema lower extremities.  However the patient has felt lightheaded and unsteady on her feet recently.  Her appetite has decreased.  Has felt mildly nauseous, but no emesis, abdominal pain, diarrhea, constipation, melena or hematochezia.  No flank pain, dysuria, frequency or hematuria.  ED Course: Initial vital signs were temperature 99.3 F, pulse 100, respiration 16, BP 139/74 mmHg O2 sat 99% on room air.  The patient received 25 mg of meclizine p.o. x1.  Lab work: Her urinalysis was normal.  CBC showed a white count of 12.0 with 72% neutrophils, hemoglobin 12.9 g/dL platelets 253.  Glucose 103 and creatinine 1.30 mg/dL.  Creatinine is slightly higher than baseline.  Influenza a PCR was positive.  Imaging: CT head without contrast did not show any acute intracranial  pathology.  There was mild age-related atrophy and chronic microvascular ischemic changes.  Please see images and full radiology report for further details.  Review of Systems: As per HPI otherwise all other systems reviewed and are negative.  Past Medical History:  Diagnosis Date   Atypical chest pain    Related to anxiety    Diabetes mellitus without complication (HCC)    DOE (dyspnea on exertion)    Hyperlipemia    Hypertension    Past Surgical History:  Procedure Laterality Date   BACK SURGERY  1986   Crushed 2 discs    BACK SURGERY  2004   Spinal stenosis   CARPAL TUNNEL RELEASE Bilateral    KNEE SURGERY Left    70 y.o.   PARTIAL HYSTERECTOMY N/A    Age 38   TUBAL LIGATION  1974   Social History  reports that she has never smoked. She has never used smokeless tobacco. She reports that she does not drink alcohol and does not use drugs.  Allergies  Allergen Reactions   Celebrex [Celecoxib] Rash   Feldene [Piroxicam] Swelling   Voltaren [Diclofenac Sodium] Rash   Family History  Problem Relation Age of Onset   Heart disease Mother        38's   Diabetes Mother    Hypertension Mother    Heart disease Father        86's   Diabetes Father    Hypertension Father    Diabetes Sister    Diabetes Brother    Cancer Brother  Colon   Diabetes Brother    Prior to Admission medications   Medication Sig Start Date End Date Taking? Authorizing Provider  acetaminophen (TYLENOL) 500 MG tablet Take 500 mg by mouth every 6 (six) hours as needed (Pt does not take unless severe pain.).    Yes [provider]  amLODipine (NORVASC) 5 MG tablet Take 5 mg by mouth daily. 06/16/21  Yes [provider]  atorvastatin (LIPITOR) 20 MG tablet Take 20 mg by mouth daily.   Yes [provider]  Dulaglutide (TRULICITY [place]) Inject 0.5 Units into the skin once a week.   Yes [provider]  Insulin Glargine (BASAGLAR KWIKPEN) 100 UNIT/ML SOPN Inject 24-30  Units into the skin 2 (two) times daily. 24 units in the AM and 30 units in the evening   Yes [provider]  losartan-hydrochlorothiazide (HYZAAR) 100-12.5 MG tablet Take 1 tablet by mouth daily.   Yes [provider]  metFORMIN (GLUCOPHAGE) 500 MG tablet Take 500 mg by mouth 2 (two) times daily with a meal.   Yes [provider]  Multiple Vitamin (MULTI-VITAMIN DAILY PO) Take 1 tablet by mouth daily.   Yes [provider]  Omega-3 Fatty Acids (FISH OIL) 1000 MG CAPS Take 1,000 mg by mouth daily.   Yes [provider]   Physical Exam: Vitals:   06/25/21 1145 06/25/21 1304 06/25/21 1317 06/25/21 1646  BP: (!) 155/69 (!) 166/74  (!) 153/70  Pulse: 90 96  94  Resp: 20 (!) 21  (!) 22  Temp: 98.4 F (36.9 C) 98 F (36.7 C)  99 F (37.2 C)  TempSrc:  Oral  Oral  SpO2: 97% 98%  94%  Weight:   75 kg   Height:   4\' 11"  (1.499 m)    Constitutional: Looks acutely ill.  NAD, calm, comfortable Eyes: PERRL, lids and conjunctivae normal.  Mild scleral injection bilaterally. ENMT: Mucous membranes are mildly dry.  Posterior pharynx is mildly erythematous. Neck: normal, supple, no masses, no thyromegaly Respiratory: Clear to auscultation bilaterally, no wheezing, no crackles. Normal respiratory effort. No accessory muscle use.  Cardiovascular: Regular rate and rhythm, no murmurs / rubs / gallops. No extremity edema. 2+ pedal pulses. No carotid bruits.  Abdomen: Obese, no distention.  Soft, no tenderness, no masses palpated. No hepatosplenomegaly. Bowel sounds positive.  Musculoskeletal: Moderate generalized weakness.  No clubbing / cyanosis.  Good ROM, no contractures. Normal muscle tone.  Skin: no acute rashes, lesions, ulcers on very limited dermatological semination. Neurologic: CN 2-12 grossly intact. Sensation intact, DTR normal. Strength 5/5 in all 4.  Patient had difficulty getting to the bathroom with the nursing staff.  Did not evaluate gait due to  high fall risk. Psychiatric: Normal judgment and insight. Alert and oriented x 3. Normal mood.   Labs on Admission: I have personally reviewed following labs and imaging studies  CBC: Recent Labs  Lab 06/24/21 2234  WBC 12.0*  NEUTROABS 8.6*  HGB 12.9  HCT 39.3  MCV 88.7  PLT 462   Basic Metabolic Panel: Recent Labs  Lab 06/24/21 2234  NA 138  K 3.8  CL 101  CO2 27  GLUCOSE 103*  BUN 20  CREATININE 1.30*  CALCIUM 9.2   GFR: Estimated Creatinine Clearance: 35 mL/min (A) (by C-G formula based on SCr of 1.3 mg/dL (H)).  Liver Function Tests: No results for input(s): AST, ALT, ALKPHOS, BILITOT, PROT, ALBUMIN in the last 168 hours.  Urine analysis:    Component  Value Date/Time   COLORURINE YELLOW 06/24/2021 2151   APPEARANCEUR CLEAR 06/24/2021 2151   LABSPEC 1.012 06/24/2021 2151   PHURINE 5.5 06/24/2021 2151   GLUCOSEU NEGATIVE 06/24/2021 2151   HGBUR NEGATIVE 06/24/2021 2151   BILIRUBINUR NEGATIVE 06/24/2021 2151   KETONESUR NEGATIVE 06/24/2021 2151   PROTEINUR NEGATIVE 06/24/2021 2151   NITRITE NEGATIVE 06/24/2021 2151   LEUKOCYTESUR NEGATIVE 06/24/2021 2151   Radiological Exams on Admission: CT Head Wo Contrast  Result Date: 06/24/2021 CLINICAL DATA:  Head trauma. EXAM: CT HEAD WITHOUT CONTRAST TECHNIQUE: Contiguous axial images were obtained from the base of the skull through the vertex without intravenous contrast. COMPARISON:  None. FINDINGS: Brain: Mild age-related atrophy and moderate chronic microvascular ischemic changes. There is no acute intracranial hemorrhage. No mass effect or midline shift no extra-axial fluid collection. Vascular: No hyperdense vessel or unexpected calcification. Skull: Normal. Negative for fracture or focal lesion. Sinuses/Orbits: No acute finding. Other: None IMPRESSION: 1. No acute intracranial pathology. 2. Mild age-related atrophy and chronic microvascular ischemic changes. Electronically Signed   By: Anner Crete M.D.   On:  06/24/2021 21:57    EKG: Independently reviewed.  Vent. rate 93 BPM PR interval 161 ms QRS duration 93 ms QT/QTcB 364/453 ms P-R-T axes 64 -67 39 Sinus rhythm Left anterior fascicular block Low voltage, precordial leads Consider anterior infarct  Assessment/Plan Principal Problem:   Generalized weakness In the setting of   Influenza A Observation/telemetry. Continue IV hydration. Begin Tamiflu twice daily for 5 days. Cepacol lozenges. Consult TOC. Consider PT evaluation in AM.  Active Problems:   Type 2 diabetes mellitus (HCC) Carbohydrate modified diet. Continue metformin 500 mg p.o. twice daily Continue Basaglar 24 units in the morning. Continue Basaglar 30 units in the evening. CBG monitoring with RI SS.    Hyperlipemia Continue atorvastatin 20 mg p.o. daily.    Hypertension Continue losartan 100 mg p.o. daily.   Continue HCTZ 12.5 mg p.o. daily.     DVT prophylaxis: Lovenox SQ. Code Status:   Full code. Family Communication:   Disposition Plan:   Patient is from:  Home.  Anticipated DC to:  TBD.  Anticipated DC date:  06/27/2021 or 06/28/2021.Marland Kitchen  Anticipated DC barriers: Clinical status.  Consults called:   Admission status:  Observation/telemetry.    Severity of Illness:High severity due to having significant generalized weakness resulting in multiple recent falls in the setting of recent dual COVID-19/influenza vaccination and contracting influenza A as well.  Reubin Milan MD [facility]  How to contact the [facility] Attending or Consulting provider [place] or covering provider during after hours [place], for this patient?   Check the care team in [facility] and look for a) attending/consulting [facility] provider listed and b) the [facility] team listed Log into [email or web address] and use [place]'s universal password to access. If you do not have the password, please contact the hospital operator. Locate the [facility] provider you are looking for under [facility] and page to a number that you can be directly reached. If you still have difficulty reaching the provider, please page the [facility] (Director on Call) for the Hospitalists listed on amion for assistance.  06/25/2021, 7:32 PM   This document was prepared using Dragon voice recognition software and may contain some unintended transcription errors.

## 2021-06-26 ENCOUNTER — Observation Stay (HOSPITAL_COMMUNITY): Payer: Medicare Other

## 2021-06-26 DIAGNOSIS — E785 Hyperlipidemia, unspecified: Secondary | ICD-10-CM | POA: Diagnosis not present

## 2021-06-26 DIAGNOSIS — R296 Repeated falls: Secondary | ICD-10-CM

## 2021-06-26 DIAGNOSIS — N1832 Chronic kidney disease, stage 3b: Secondary | ICD-10-CM

## 2021-06-26 DIAGNOSIS — E1122 Type 2 diabetes mellitus with diabetic chronic kidney disease: Secondary | ICD-10-CM

## 2021-06-26 DIAGNOSIS — J101 Influenza due to other identified influenza virus with other respiratory manifestations: Secondary | ICD-10-CM

## 2021-06-26 DIAGNOSIS — R531 Weakness: Secondary | ICD-10-CM | POA: Diagnosis not present

## 2021-06-26 DIAGNOSIS — I10 Essential (primary) hypertension: Secondary | ICD-10-CM

## 2021-06-26 DIAGNOSIS — Z794 Long term (current) use of insulin: Secondary | ICD-10-CM

## 2021-06-26 LAB — COMPREHENSIVE METABOLIC PANEL
ALT: 24 U/L (ref 0–44)
AST: 28 U/L (ref 15–41)
Albumin: 3.7 g/dL (ref 3.5–5.0)
Alkaline Phosphatase: 55 U/L (ref 38–126)
Anion gap: 9 (ref 5–15)
BUN: 21 mg/dL (ref 8–23)
CO2: 25 mmol/L (ref 22–32)
Calcium: 8.8 mg/dL — ABNORMAL LOW (ref 8.9–10.3)
Chloride: 103 mmol/L (ref 98–111)
Creatinine, Ser: 1.34 mg/dL — ABNORMAL HIGH (ref 0.44–1.00)
GFR, Estimated: 42 mL/min — ABNORMAL LOW (ref >60)
Glucose, Bld: 104 mg/dL — ABNORMAL HIGH (ref 70–99)
Potassium: 3.5 mmol/L (ref 3.5–5.1)
Sodium: 137 mmol/L (ref 135–145)
Total Bilirubin: 0.8 mg/dL (ref 0.3–1.2)
Total Protein: 7.3 g/dL (ref 6.5–8.1)

## 2021-06-26 LAB — GLUCOSE, CAPILLARY
Glucose-Capillary: 102 mg/dL — ABNORMAL HIGH (ref 70–99)
Glucose-Capillary: 148 mg/dL — ABNORMAL HIGH (ref 70–99)
Glucose-Capillary: 98 mg/dL (ref 70–99)
Glucose-Capillary: 113 mg/dL — ABNORMAL HIGH (ref 70–99)

## 2021-06-26 LAB — HEMOGLOBIN A1C
Hgb A1c MFr Bld: 6 % — ABNORMAL HIGH (ref 4.8–5.6)
Mean Plasma Glucose: 125.5 mg/dL

## 2021-06-26 LAB — CBC
HCT: 41.2 % (ref 36.0–46.0)
Hemoglobin: 13.5 g/dL (ref 12.0–15.0)
MCH: 29.4 pg (ref 26.0–34.0)
MCHC: 32.8 g/dL (ref 30.0–36.0)
MCV: 89.8 fL (ref 80.0–100.0)
Platelets: 248 10*3/uL (ref 150–400)
RBC: 4.59 MIL/uL (ref 3.87–5.11)
RDW: 13.2 % (ref 11.5–15.5)
WBC: 14.3 10*3/uL — ABNORMAL HIGH (ref 4.0–10.5)
nRBC: 0 % (ref 0.0–0.2)

## 2021-06-26 MED ORDER — GUAIFENESIN ER 600 MG PO TB12
1200.0000 mg | ORAL_TABLET | Freq: Two times a day (BID) | ORAL | Status: DC
  Administered 2021-06-26 – 2021-06-27 (×3): 1200 mg via ORAL
  Filled 2021-06-26 (×3): qty 2

## 2021-06-26 MED ORDER — INSULIN ASPART 100 UNIT/ML IJ SOLN: 0.0000 [IU] | Freq: Three times a day (TID) | INTRAMUSCULAR | Status: DC

## 2021-06-26 MED ORDER — LACTATED RINGERS IV SOLN: INTRAVENOUS | Status: AC

## 2021-06-26 MED ORDER — INSULIN GLARGINE-YFGN 100 UNIT/ML ~~LOC~~ SOLN
15.0000 [IU] | Freq: Every day | SUBCUTANEOUS | Status: DC
  Administered 2021-06-26: 15 [IU] via SUBCUTANEOUS
  Filled 2021-06-26 (×2): qty 0.15

## 2021-06-26 MED ORDER — INSULIN GLARGINE-YFGN 100 UNIT/ML ~~LOC~~ SOLN
12.0000 [IU] | Freq: Every day | SUBCUTANEOUS | Status: DC
  Administered 2021-06-27: 12 [IU] via SUBCUTANEOUS
  Filled 2021-06-26: qty 0.12

## 2021-06-26 MED ORDER — SODIUM CHLORIDE 0.9 % IV BOLUS
500.0000 mL | Freq: Once | INTRAVENOUS | Status: AC
  Administered 2021-06-26: 500 mL via INTRAVENOUS

## 2021-06-26 MED ORDER — INSULIN GLARGINE-YFGN 100 UNIT/ML ~~LOC~~ SOLN
25.0000 [IU] | Freq: Every day | SUBCUTANEOUS | Status: DC
Start: 2021-06-26 — End: 2021-06-26
  Filled 2021-06-26: qty 0.25

## 2021-06-26 MED ORDER — INSULIN GLARGINE-YFGN 100 UNIT/ML ~~LOC~~ SOLN
20.0000 [IU] | Freq: Every day | SUBCUTANEOUS | Status: DC
Start: 2021-06-27 — End: 2021-06-26

## 2021-06-26 MED ORDER — MECLIZINE HCL 25 MG PO TABS: 12.5000 mg | ORAL_TABLET | Freq: Three times a day (TID) | ORAL | Status: DC | PRN

## 2021-06-26 NOTE — Progress Notes (Signed)
Inpatient Diabetes Program Recommendations  AACE/ADA: New Consensus Statement on Inpatient Glycemic Control (2015)  Target Ranges:  Prepandial:   less than 140 mg/dL      Peak postprandial:   less than 180 mg/dL (1-2 hours)      Critically ill patients:  140 - 180 mg/dL    Latest Reference Range & Units 06/25/21 13:36 06/25/21 16:43 06/25/21 20:49  Glucose-Capillary 70 - 99 mg/dL 163 (H) 128 (H) 152 (H)  30 units Semglee    Latest Reference Range & Units 06/26/21 05:16  Hemoglobin A1C 4.8 - 5.6 % 6.0 (H)     Admit with: Generalized weakness/ Influenza A  History: DM  Home DM Meds: Trulicity 0.5 mg Qweek        Basaglar 24 units AM/ 30 units PM        Metformin 500 mg BID  Current Orders: Semglee 24 units Daily      Semglee 30 units QHS      Metformin 500 mg BID    MD- Note Semglee started last PM.  Lab glucose 104 this AM.  Please consider the following:  1. Reduce Semglee doses slightly for now (80% total home doses): Semglee 20 units QAM Semglee 25 units QHS  2. Start Novolog Sensitive Correction Scale/ SSI (0-9 units) TID AC + HS     --Will follow patient during hospitalization--  Wyn Quaker RN, MSN, CDE Diabetes Coordinator Inpatient Glycemic Control Team Team Pager: [phone] (8a-5p)

## 2021-06-26 NOTE — Progress Notes (Signed)
OT Cancellation Note  Patient Details Name: Yesenia Meyers MRN: [id number] DOB: [birth year]   Cancelled Treatment:    Reason Eval/Treat Not Completed: Fatigue/lethargy limiting ability to participate: Pt declined OT Evaluation despite encouragement stating that she is too fatigued after mobilizing out of bed with a mobility specialist this morning before pt's MRI. Pt asked to try tomorrow. Will continue efforts as able.   Julien Girt 06/26/2021, 2:10 PM

## 2021-06-26 NOTE — Progress Notes (Signed)
°   06/26/21 1100  Mobility  Activity Ambulated in room;Stood at bedside  Level of Assistance Contact guard assist, steadying assist  Assistive Device Front wheel walker  Distance Ambulated (ft) 30 ft  Mobility Ambulated with assistance in room  Mobility Response Tolerated well;Tolerated fair  Mobility performed by Mobility specialist  $Mobility charge 1 Mobility   Pt agreeable to mobilize this morning. MD present in room upon arrival. Pt and MD stated pt was having dizzy spells with unknown cause. Had pt stand in front of recliner for a couple minutes to see how she felt before proceeding. She stated she would feel more comfortable staying int he room to proceed with the session. Had pt walk from one side of the bed to the other and back with RW. About half way through, pt stated she felt nervous about everything going on and her upcoming MRI. Ensured pt she would get through it. Got pt back to the chair, with call bell at side and RN present.  [place] Specialist Acute Rehab Services Office: [phone]

## 2021-06-26 NOTE — Progress Notes (Signed)
PROGRESS NOTE    Yesenia Meyers  JSE:[id number] DOB: 09/21/1950 DOA: 06/24/2021 PCP: Leighton Ruff, MD (Inactive)   Brief Narrative:  Patient is a 70 year old obese Caucasian female with a past medical history significant for but not limited to atypical chest pain, anxiety and depression, type 2 diabetes mellitus which is insulin-dependent, hypertension as well as other comorbidities who presented to the ED with chief complaint of generalized weakness and frequent falls in last week or so.  The day before yesterday she fell and hit her head.  She got to the couch and then fell was unable to get up.  She did call her sister and rolled out of the couch and then crawled to the door to unlock it so they could get in.  She is recently vaccinated for influenza and COVID-19.  Patient had some rhinorrhea and sore throat with occasional dry cough but no dyspnea, chest pain or palpitation.  She did feel lightheaded and unsteady on her feet recently.  Her appetite has been decreased and she is felt mildly nauseous but no emesis abdominal pain or diarrhea.  In the ED she was found to be having a temperature of 99.3, pulse of 100, respirations of 16.  Further work-up revealed that she tested positive for influenza A.  Is also noted that her creatinine was slightly higher than baseline.  Admitted for her generalized weakness with recurrent falls and likely her generalized weakness worse in the setting of influenza  Assessment & Plan:   Principal Problem:   Generalized weakness Active Problems:   Type 2 diabetes mellitus (HCC)   Influenza A   Hyperlipemia   Hypertension  Generalized weakness and physical debility in the setting of influenza A Recurrent falls likely in the setting of suspected hypoglycemic episodes Dizziness -She was placed in observation as a telemetry  -She was initiated on IV fluid hydration with LR at 100 MLS per hour for 20 hours which is now expired.  We will renew for another 12  hours but at 75 MLS per hour -Given her recent falls and dizziness head CT scan was obtained which showed no acute intracranial pathology and mild age-related atrophy and chronic microvascular ischemic changes -Given that she has intermittent dizziness and has fallen several times we will also order an MRI for further evaluation to rule out cerebellar causes and MRI of the brain done showed no acute intracranial abnormalities but there was also moderate chronic small vessel ischemic disease and cerebral atrophy noted -We will obtain PT and OT to further evaluate and treat -We will check a TSH in the morning -We will also obtain orthostatic vital signs -Patient is complaining of some left ear issues so we will also start her on meclizine 12.5 mg p.o. 3 times daily as needed dizziness -Given her influenza A we will initiate Oseltamivir 30 mg po BID -Also continue Cepacol lozenges 3 mg p.o. as needed.  Sore throat and add flutter valve, incentive spirometry as well as guaifenesin 1200 mg p.o. twice daily  Diabetes Mellitus Type 2 -Very well controlled with a hemoglobin of 6.0 -Suspect that she is having hypoglycemic episodes as she feels she gets dizzy and has tremors.  States that her blood sugar runs around 75 normally and suspect that she is getting these episodes of dizziness in the setting of hypoglycemia and falls -We will continue metformin 500 g p.o. twice daily and change her insulin regimen to Basaglar 12 units in the a.m. and 15 units in the evening -  We will add sensitive NovoLog SSI AC -Monitor CBGs per protocol currently CBGs ranging from 102-163  Hyperlipidemia -Continue with atorvastatin 20 mg p.o. daily  Hypertension -Continue home antihypertensives including losartan 100 mg p.o. daily and as well as amlodipine 5 mg daily; will stop hydrochlorothiazide for now -We will continue to monitor blood pressures per protocol -Last blood pressure reading was  127/60  Leukocytosis/neutrophilia -Has a history of elevated WBC which is chronic and has been evaluated in the outpatient setting by hematology -Patient has a history of MGUS and recently saw Dr. Elmo Putt in the outpatient setting back in September -WBC ranged from 12.4-14,000 -Her recent myeloma panel showed a stable M protein of 0.3 g/dL which is unchanged  CKD stage IIIb -Stable and around at baseline -She had a creatinine back in September which was 1.27; BUN/creatinine here has gone from 20/1.30 and is now 21/1.34 -Continue to monitor and trend renal function panel carefully -Avoid nephrotoxic medications, contrast dyes, hypotension and dehydration and renally adjust medications -Repeat CMP in a.m.  Obesity -Complicates overall prognosis and care -Estimated body mass index is 33.39 kg/m as calculated from the following:   Height as of this encounter: 4\' 11"  (1.499 m).   Weight as of this encounter: 75 kg.  -Weight Loss and Dietary Counseling given  DVT prophylaxis: Enoxaparin 40 mg sq q24h Code Status: FULL CODE  Family Communication: Discussed with Sister at bedside  Disposition Plan: Pending further clinical improvement and evaluation with orthostatic vital signs in evaluation of PT and OT given her recurrent falls  Status is: Observation  The patient will require care spanning > 2 midnights and should be moved to inpatient because: Still needs PT OT to evaluate and treat and will continue to monitor blood sugars carefully to ensure that she does not become hypoglycemic   Consultants:  None  Procedures:  None  Antimicrobials: Anti-infectives (From admission, onward)    Start     Dose/Rate Route Frequency Ordered Stop   06/25/21 2200  oseltamivir (TAMIFLU) capsule 30 mg        30 mg Oral 2 times daily 06/25/21 1323 06/30/21 0959   06/25/21 1415  oseltamivir (TAMIFLU) capsule 75 mg  Status:  Discontinued        75 mg Oral 2 times daily 06/25/21 1317 06/25/21 1323    06/25/21 1400  oseltamivir (TAMIFLU) capsule 75 mg        75 mg Oral  Once 06/25/21 1323 06/25/21 1424        Subjective: Seen and examined at bedside and she states that she has had intermittent dizziness and issues with her left ear periodically.  States that sometimes that she when she stands up she gets dizzy and has fallen multiple times.  Also states that her blood sugars run on the lower side but she does not check quite often.  Hemoglobin A1c is fairly well controlled and she describes what appears to be tremulous episodes when she gets dizzy.  She denies any nausea, chest pain, shortness of breath at this time.  No lightheadedness or dizziness currently.  No other concerns or complaints at this time.  Objective: Vitals:   06/25/21 2033 06/26/21 0105 06/26/21 0508 06/26/21 1219  BP: (!) 172/77 (!) 143/69 (!) 160/59 127/60  Pulse: 97 91 93 (!) 103  Resp: 20 20 20 18   Temp: 98.4 F (36.9 C) 98 F (36.7 C) 98.5 F (36.9 C) 99 F (37.2 C)  TempSrc:  Oral  Oral  SpO2: 93%  93% 98% 95%  Weight:      Height:        Intake/Output Summary (Last 24 hours) at 06/26/2021 1300 Last data filed at 06/26/2021 0600 Gross per 24 hour  Intake 930.61 ml  Output --  Net 930.61 ml   Filed Weights   06/25/21 1317  Weight: 75 kg   Examination: Physical Exam:  Constitutional: WN/WD obese Caucasian female in no acute distress appears calm sitting in chair bedside Eyes: Lids and conjunctivae normal, sclerae anicteric  ENMT: External Ears, Nose appear normal. Grossly normal hearing. Mucous membranes are moist.   Neck: Appears normal, supple, no cervical masses, normal ROM, no appreciable thyromegaly; no appreciable JVD Respiratory: Diminished to auscultation bilaterally, no wheezing, rales, rhonchi or crackles. Normal respiratory effort and patient is not tachypenic. No accessory muscle use.  Unlabored breathing Cardiovascular: RRR, no murmurs / rubs / gallops. S1 and S2 auscultated.  Trace  lower extremity edema Abdomen: Soft, non-tender, distended secondary body habitus. Bowel sounds positive.  GU: Deferred. Musculoskeletal: No clubbing / cyanosis of digits/nails. No joint deformity upper and lower extremities.  Skin: No rashes, lesions, ulcers on limited skin evaluation. No induration; Warm and dry.  Neurologic: CN 2-12 grossly intact with no focal deficits.  Romberg sign and cerebellar reflexes not assessed.  Psychiatric: Normal judgment and insight. Alert and oriented x 3. Normal mood and appropriate affect.   Data Reviewed: I have personally reviewed following labs and imaging studies  CBC: Recent Labs  Lab 06/24/21 2234 06/26/21 0516  WBC 12.0* 14.3*  NEUTROABS 8.6*  --   HGB 12.9 13.5  HCT 39.3 41.2  MCV 88.7 89.8  PLT 253 846   Basic Metabolic Panel: Recent Labs  Lab 06/24/21 2234 06/26/21 0516  NA 138 137  K 3.8 3.5  CL 101 103  CO2 27 25  GLUCOSE 103* 104*  BUN 20 21  CREATININE 1.30* 1.34*  CALCIUM 9.2 8.8*   GFR: Estimated Creatinine Clearance: 34 mL/min (A) (by C-G formula based on SCr of 1.34 mg/dL (H)). Liver Function Tests: Recent Labs  Lab 06/26/21 0516  AST 28  ALT 24  ALKPHOS 55  BILITOT 0.8  PROT 7.3  ALBUMIN 3.7   No results for input(s): LIPASE, AMYLASE in the last 168 hours. No results for input(s): AMMONIA in the last 168 hours. Coagulation Profile: No results for input(s): INR, PROTIME in the last 168 hours. Cardiac Enzymes: No results for input(s): CKTOTAL, CKMB, CKMBINDEX, TROPONINI in the last 168 hours. BNP (last 3 results) No results for input(s): PROBNP in the last 8760 hours. HbA1C: Recent Labs    06/26/21 0516  HGBA1C 6.0*   CBG: Recent Labs  Lab 06/25/21 1336 06/25/21 1643 06/25/21 2049 06/26/21 0726 06/26/21 1156  GLUCAP 163* 128* 152* 102* 148*   Lipid Profile: No results for input(s): CHOL, HDL, LDLCALC, TRIG, CHOLHDL, LDLDIRECT in the last 72 hours. Thyroid Function Tests: No results for  input(s): TSH, T4TOTAL, FREET4, T3FREE, THYROIDAB in the last 72 hours. Anemia Panel: No results for input(s): VITAMINB12, FOLATE, FERRITIN, TIBC, IRON, RETICCTPCT in the last 72 hours. Sepsis Labs: No results for input(s): PROCALCITON, LATICACIDVEN in the last 168 hours.  Recent Results (from the past 240 hour(s))  Resp Panel by RT-PCR (Flu A&B, Covid) Nasopharyngeal Swab     Status: Abnormal   Collection Time: 06/24/21 10:55 PM   Specimen: Nasopharyngeal Swab; Nasopharyngeal(NP) swabs in vial transport medium  Result Value Ref Range Status   SARS Coronavirus 2 by RT  PCR NEGATIVE NEGATIVE Final    Comment: (NOTE) SARS-CoV-2 target nucleic acids are NOT DETECTED.  The SARS-CoV-2 RNA is generally detectable in upper respiratory specimens during the acute phase of infection. The lowest concentration of SARS-CoV-2 viral copies this assay can detect is 138 copies/mL. A negative result does not preclude SARS-Cov-2 infection and should not be used as the sole basis for treatment or other patient management decisions. A negative result may occur with  improper specimen collection/handling, submission of specimen other than nasopharyngeal swab, presence of viral mutation(s) within the areas targeted by this assay, and inadequate number of viral copies(<138 copies/mL). A negative result must be combined with clinical observations, patient history, and epidemiological information. The expected result is Negative.  Fact Sheet for Patients:  EntrepreneurPulse.com.au  Fact Sheet for Healthcare Providers:  IncredibleEmployment.be  This test is no t yet approved or cleared by the [place] FDA and  has been authorized for detection and/or diagnosis of SARS-CoV-2 by FDA under an Emergency Use Authorization (EUA). This EUA will remain  in effect (meaning this test can be used) for the duration of the COVID-19 declaration under Section 564(b)(1) of the Act,  21 U.S.C.section 360bbb-3(b)(1), unless the authorization is terminated  or revoked sooner.       Influenza A by PCR POSITIVE (A) NEGATIVE Final   Influenza B by PCR NEGATIVE NEGATIVE Final    Comment: (NOTE) The Xpert Xpress SARS-CoV-2/FLU/RSV plus assay is intended as an aid in the diagnosis of influenza from Nasopharyngeal swab specimens and should not be used as a sole basis for treatment. Nasal washings and aspirates are unacceptable for Xpert Xpress SARS-CoV-2/FLU/RSV testing.  Fact Sheet for Patients: EntrepreneurPulse.com.au  Fact Sheet for Healthcare Providers: IncredibleEmployment.be  This test is not yet approved or cleared by the [place] FDA and has been authorized for detection and/or diagnosis of SARS-CoV-2 by FDA under an Emergency Use Authorization (EUA). This EUA will remain in effect (meaning this test can be used) for the duration of the COVID-19 declaration under Section 564(b)(1) of the Act, 21 U.S.C. section 360bbb-3(b)(1), unless the authorization is terminated or revoked.  Performed at [facility], [address] [place] [address]     RN Pressure Injury Documentation:     Estimated body mass index is 33.39 kg/m as calculated from the following:   Height as of this encounter: 4\' 11"  (1.499 m).   Weight as of this encounter: 75 kg.  Malnutrition Type:   Malnutrition Characteristics:   Nutrition Interventions:   Radiology Studies: CT Head Wo Contrast  Result Date: 06/24/2021 CLINICAL DATA:  Head trauma. EXAM: CT HEAD WITHOUT CONTRAST TECHNIQUE: Contiguous axial images were obtained from the base of the skull through the vertex without intravenous contrast. COMPARISON:  None. FINDINGS: Brain: Mild age-related atrophy and moderate chronic microvascular ischemic changes. There is no acute intracranial hemorrhage. No mass effect or midline shift no extra-axial fluid collection.  Vascular: No hyperdense vessel or unexpected calcification. Skull: Normal. Negative for fracture or focal lesion. Sinuses/Orbits: No acute finding. Other: None IMPRESSION: 1. No acute intracranial pathology. 2. Mild age-related atrophy and chronic microvascular ischemic changes. Electronically Signed   By: Anner Crete M.D.   On: 06/24/2021 21:57   MR BRAIN WO CONTRAST  Result Date: 06/26/2021 CLINICAL DATA:  Dizziness, persistent/recurrent, cardiac or vascular cause suspected. EXAM: MRI HEAD WITHOUT CONTRAST TECHNIQUE: Multiplanar, multiecho pulse sequences of the brain and surrounding structures were obtained without intravenous contrast. COMPARISON:  Head CT 06/24/2021 FINDINGS:  Brain: There is no evidence of an acute infarct, intracranial hemorrhage, mass, midline shift, or extra-axial fluid collection. Patchy T2 hyperintensities in the cerebral white matter nonspecific but compatible with moderate chronic small vessel ischemic disease. There is mild generalized cerebral atrophy. The posterior pituitary bright spot is prominent with normal overall size of the pituitary gland. Vascular: Major intracranial vascular flow voids are preserved. Skull and upper cervical spine: Unremarkable bone marrow signal para Sinuses/Orbits: Bilateral cataract extraction. Paranasal sinuses and mastoid air cells are clear. Other: None. IMPRESSION: 1. No acute intracranial abnormality. 2. Moderate chronic small vessel ischemic disease. 3.  Cerebral Atrophy (ICD10-G31.9). Electronically Signed   By: Logan Bores M.D.   On: 06/26/2021 11:56    Scheduled Meds:  amLODipine  5 mg Oral Daily   atorvastatin  20 mg Oral Daily   enoxaparin (LOVENOX) injection  40 mg Subcutaneous Q24H   [START ON 06/27/2021] insulin glargine-yfgn  12 Units Subcutaneous Daily   insulin glargine-yfgn  15 Units Subcutaneous QHS   losartan  100 mg Oral Daily   mouth rinse  15 mL Mouth Rinse BID   metFORMIN  500 mg Oral BID WC   oseltamivir  30  mg Oral BID   Continuous Infusions:   LOS: 0 days   Kerney Elbe, DO [facility] PAGER is on AMION  If 7PM-7AM, please contact night-coverage [email or web address]

## 2021-06-27 DIAGNOSIS — J101 Influenza due to other identified influenza virus with other respiratory manifestations: Secondary | ICD-10-CM | POA: Diagnosis not present

## 2021-06-27 DIAGNOSIS — R531 Weakness: Secondary | ICD-10-CM | POA: Diagnosis not present

## 2021-06-27 DIAGNOSIS — I10 Essential (primary) hypertension: Secondary | ICD-10-CM | POA: Diagnosis not present

## 2021-06-27 DIAGNOSIS — E785 Hyperlipidemia, unspecified: Secondary | ICD-10-CM | POA: Diagnosis not present

## 2021-06-27 LAB — CBC WITH DIFFERENTIAL/PLATELET
Abs Immature Granulocytes: 0.04 10*3/uL (ref 0.00–0.07)
Basophils Absolute: 0 10*3/uL (ref 0.0–0.1)
Basophils Relative: 0 %
Eosinophils Absolute: 0.3 10*3/uL (ref 0.0–0.5)
Eosinophils Relative: 3 %
HCT: 37.9 % (ref 36.0–46.0)
Hemoglobin: 12.3 g/dL (ref 12.0–15.0)
Immature Granulocytes: 0 %
Lymphocytes Relative: 29 %
Lymphs Abs: 3.5 10*3/uL (ref 0.7–4.0)
MCH: 29.1 pg (ref 26.0–34.0)
MCHC: 32.5 g/dL (ref 30.0–36.0)
MCV: 89.8 fL (ref 80.0–100.0)
Monocytes Absolute: 1.2 10*3/uL — ABNORMAL HIGH (ref 0.1–1.0)
Monocytes Relative: 10 %
Neutro Abs: 6.9 10*3/uL (ref 1.7–7.7)
Neutrophils Relative %: 58 %
Platelets: 234 10*3/uL (ref 150–400)
RBC: 4.22 MIL/uL (ref 3.87–5.11)
RDW: 13.1 % (ref 11.5–15.5)
WBC: 12 10*3/uL — ABNORMAL HIGH (ref 4.0–10.5)
nRBC: 0 % (ref 0.0–0.2)

## 2021-06-27 LAB — COMPREHENSIVE METABOLIC PANEL
ALT: 22 U/L (ref 0–44)
AST: 23 U/L (ref 15–41)
Albumin: 3.6 g/dL (ref 3.5–5.0)
Alkaline Phosphatase: 50 U/L (ref 38–126)
Anion gap: 8 (ref 5–15)
BUN: 20 mg/dL (ref 8–23)
CO2: 26 mmol/L (ref 22–32)
Calcium: 8.5 mg/dL — ABNORMAL LOW (ref 8.9–10.3)
Chloride: 105 mmol/L (ref 98–111)
Creatinine, Ser: 1.23 mg/dL — ABNORMAL HIGH (ref 0.44–1.00)
GFR, Estimated: 47 mL/min — ABNORMAL LOW (ref >60)
Glucose, Bld: 79 mg/dL (ref 70–99)
Potassium: 3.4 mmol/L — ABNORMAL LOW (ref 3.5–5.1)
Sodium: 139 mmol/L (ref 135–145)
Total Bilirubin: 1.1 mg/dL (ref 0.3–1.2)
Total Protein: 7 g/dL (ref 6.5–8.1)

## 2021-06-27 LAB — GLUCOSE, CAPILLARY
Glucose-Capillary: 84 mg/dL (ref 70–99)
Glucose-Capillary: 99 mg/dL (ref 70–99)
Glucose-Capillary: 88 mg/dL (ref 70–99)

## 2021-06-27 LAB — TSH: TSH: 1.672 u[IU]/mL (ref 0.350–4.500)

## 2021-06-27 LAB — PHOSPHORUS: Phosphorus: 3.5 mg/dL (ref 2.5–4.6)

## 2021-06-27 LAB — MAGNESIUM: Magnesium: 2 mg/dL (ref 1.7–2.4)

## 2021-06-27 LAB — VITAMIN B12: Vitamin B-12: 157 pg/mL — ABNORMAL LOW (ref 180–914)

## 2021-06-27 MED ORDER — POTASSIUM CHLORIDE CRYS ER 20 MEQ PO TBCR
40.0000 meq | EXTENDED_RELEASE_TABLET | Freq: Two times a day (BID) | ORAL | Status: DC
  Administered 2021-06-27: 40 meq via ORAL
  Filled 2021-06-27: qty 2

## 2021-06-27 MED ORDER — OSELTAMIVIR PHOSPHATE 30 MG PO CAPS: 30.0000 mg | ORAL_CAPSULE | Freq: Two times a day (BID) | ORAL | 0 refills | Status: DC

## 2021-06-27 MED ORDER — LOSARTAN POTASSIUM 100 MG PO TABS: 100.0000 mg | ORAL_TABLET | Freq: Every day | ORAL | 0 refills | Status: AC

## 2021-06-27 MED ORDER — ONDANSETRON HCL 4 MG PO TABS: 4.0000 mg | ORAL_TABLET | Freq: Four times a day (QID) | ORAL | 0 refills | Status: DC | PRN

## 2021-06-27 MED ORDER — MENTHOL 3 MG MT LOZG: 1.0000 | LOZENGE | OROMUCOSAL | 0 refills | Status: DC | PRN

## 2021-06-27 MED ORDER — MENTHOL 3 MG MT LOZG: 1.0000 | LOZENGE | OROMUCOSAL | 12 refills | Status: DC | PRN

## 2021-06-27 MED ORDER — GUAIFENESIN ER 600 MG PO TB12: 600.0000 mg | ORAL_TABLET | Freq: Two times a day (BID) | ORAL | 0 refills | Status: DC

## 2021-06-27 MED ORDER — LOSARTAN POTASSIUM 100 MG PO TABS: 100.0000 mg | ORAL_TABLET | Freq: Every day | ORAL | 0 refills | Status: DC

## 2021-06-27 MED ORDER — BASAGLAR KWIKPEN 100 UNIT/ML ~~LOC~~ SOPN
12.0000 [IU] | PEN_INJECTOR | Freq: Two times a day (BID) | SUBCUTANEOUS | Status: AC
Start: 2021-06-27 — End: ?

## 2021-06-27 MED ORDER — SODIUM CHLORIDE 0.9 % IV BOLUS
500.0000 mL | Freq: Once | INTRAVENOUS | Status: AC
  Administered 2021-06-27: 500 mL via INTRAVENOUS

## 2021-06-27 MED ORDER — GUAIFENESIN ER 600 MG PO TB12: 600.0000 mg | ORAL_TABLET | Freq: Two times a day (BID) | ORAL | 0 refills | Status: AC

## 2021-06-27 NOTE — TOC CM/SW Note (Signed)
°  Transition of Care [facility]) Screening Note   Patient Details  Name: Yesenia Meyers Date of Birth: 01-01-1951   Transition of Care [facility]) CM/SW Contact:    Ross Ludwig, LCSW Phone Number: 06/27/2021, 1:57 PM    Transition of Care Department [facility]) has reviewed patient and no TOC needs have been identified at this time. We will continue to monitor patient advancement through interdisciplinary progression rounds. If new patient transition needs arise, please place a TOC consult.

## 2021-06-27 NOTE — Progress Notes (Signed)
Pt leaving this evening with her sister. Alert and oriented.  Pt is without c/o. Discharge instructions given/explained with pt verbalizing understanding.  Pt aware to followup with PCP.

## 2021-06-27 NOTE — Evaluation (Signed)
Occupational Therapy Evaluation Patient Details Name: Yesenia Meyers MRN: [id number] DOB: October 13, 1950 Today's Date: 06/27/2021   History of Present Illness Pt is 70 yo female admitted 06/24/21 with generalized weakness and found to have flu.  Pt with history including but not limited to  atypical chest pain, anxiety and depression, type 2 diabetes mellitus which is insulin-dependent, hypertension   Clinical Impression   Ms. [name] is a 70 year old woman s/p fall at home. On evaluation she demonstrates good upper body strength, ability to perform BADLs and functional mobility with RW. Patient had no overt loss of balance ,complaints of dizziness or fatigue. Patient has no OT needs at this time.       Recommendations for follow up therapy are one component of a multi-disciplinary discharge planning process, led by the attending physician.  Recommendations may be updated based on patient status, additional functional criteria and insurance authorization.   Follow Up Recommendations  No OT follow up    Assistance Recommended at Discharge None  Functional Status Assessment  Patient has had a recent decline in their functional status and demonstrates the ability to make significant improvements in function in a reasonable and predictable amount of time.  Equipment Recommendations  None recommended by OT    Recommendations for Other Services       Precautions / Restrictions Precautions Precautions: Fall Restrictions Weight Bearing Restrictions: No      Mobility Bed Mobility Overal bed mobility: Independent                  Transfers Overall transfer level: Needs assistance Equipment used: Rolling walker (2 wheels) Transfers: Sit to/from Stand Sit to Stand: Supervision           General transfer comment: Supervision for safety; performed sit to stand from bed and toilet; toielting ADLs independently      Balance Overall balance assessment: Mild deficits  observed, not formally tested Sitting-balance support: No upper extremity supported Sitting balance-Leahy Scale: Normal     Standing balance support: No upper extremity supported;Bilateral upper extremity supported Standing balance-Leahy Scale: Fair Standing balance comment: RW to ambulate but could static stand/ADLs wtihout support                           ADL either performed or assessed with clinical judgement   ADL Overall ADL's : At baseline                                             Vision Patient Visual Report: No change from baseline       Perception     Praxis      Pertinent Vitals/Pain Pain Assessment: No/denies pain     Hand Dominance Right   Extremity/Trunk Assessment Upper Extremity Assessment Upper Extremity Assessment: RUE deficits/detail;LUE deficits/detail RUE Deficits / Details: WFL ROM, 4+/5 strength throughout RUE Sensation: WNL RUE Coordination: WNL LUE Deficits / Details: WFL ROM, 4+/5 strength throughout LUE Sensation: WNL LUE Coordination: WNL   Lower Extremity Assessment Lower Extremity Assessment: Defer to PT evaluation   Cervical / Trunk Assessment Cervical / Trunk Assessment: Normal   Communication Communication Communication: No difficulties   Cognition Arousal/Alertness: Awake/alert Behavior During Therapy: WFL for tasks assessed/performed Overall Cognitive Status: Within Functional Limits for tasks assessed  General Comments  Pt reports feeling back to baseline.    Exercises     Shoulder Instructions      Home Living Family/patient expects to be discharged to:: Private residence Living Arrangements: Alone   Type of Home: House Home Access: Stairs to enter [employer] of Steps: 4 Entrance Stairs-Rails: Can reach both Home Layout: One level     Bathroom Shower/Tub: Teacher, early years/pre: Standard     Home  Equipment: Rollator (4 wheels);Tub bench;Grab bars - tub/shower   Additional Comments: Pt moving to [facility] in January      Prior Functioning/Environment Prior Level of Function : Independent/Modified Independent             Mobility Comments: Could ambulate in community with cane; furniture surfs at home; reports a few falls in past year.  Falls occurred with reaching or doing task , not walking ADLs Comments: Independent with ADLs and IADLs        OT Problem List:        OT Treatment/Interventions:      OT Goals(Current goals can be found in the care plan section) Acute Rehab OT Goals OT Goal Formulation: All assessment and education complete, DC therapy  OT Frequency:     Barriers to D/C:            Co-evaluation   Reason for Co-Treatment: For patient/therapist safety (hx recent falls) PT goals addressed during session: Mobility/safety with mobility OT goals addressed during session: ADL's and self-care      AM-PAC OT "6 Clicks" Daily Activity     Outcome Measure Help from another person eating meals?: None Help from another person taking care of personal grooming?: None Help from another person toileting, which includes using toliet, bedpan, or urinal?: None Help from another person bathing (including washing, rinsing, drying)?: None Help from another person to put on and taking off regular upper body clothing?: None Help from another person to put on and taking off regular lower body clothing?: None 6 Click Score: 24   End of Session Equipment Utilized During Treatment: Rolling walker (2 wheels) Nurse Communication: Mobility status  Activity Tolerance: Patient tolerated treatment well Patient left: in bed;with call bell/phone within reach;with bed alarm set  OT Visit Diagnosis: Muscle weakness (generalized) (M62.81)                Time: 1125-1140 OT Time Calculation (min): 15 min Charges:  OT General Charges $OT Visit: 1 Visit OT Evaluation $OT  Eval Low Complexity: 1 Low  Infantof Villagomez, OTR/L [place]  Office [phone] Pager: [place] 06/27/2021, 12:06 PM

## 2021-06-27 NOTE — Discharge Summary (Signed)
Physician Discharge Summary  Yesenia Meyers QVZ:[id number] DOB: 03-19-1951 DOA: 06/24/2021  PCP: Leighton Ruff, MD (Inactive)  Admit date: 06/24/2021 Discharge date: 06/27/2021  Admitted From: Home Disposition: Home with Home Health  Recommendations for Outpatient Follow-up:  Follow up with PCP in 1-2 weeks Please obtain CMP/CBC,Mag, Phos in one week Please follow up on the following pending results:  Home Health: No  Equipment/Devices: None    Discharge Condition: Stable  CODE STATUS: FULL CODE   Diet recommendation: Heart Healthy Carb Modified Diet   Brief/Interim Summary: Patient is a 70 year old obese Caucasian female with a past medical history significant for but not limited to atypical chest pain, anxiety and depression, type 2 diabetes mellitus which is insulin-dependent, hypertension as well as other comorbidities who presented to the ED with chief complaint of generalized weakness and frequent falls in last week or so.  The day before yesterday she fell and hit her head.  She got to the couch and then fell was unable to get up.  She did call her sister and rolled out of the couch and then crawled to the door to unlock it so they could get in.  She is recently vaccinated for influenza and COVID-19.  Patient had some rhinorrhea and sore throat with occasional dry cough but no dyspnea, chest pain or palpitation.  She did feel lightheaded and unsteady on her feet recently.  Her appetite has been decreased and she is felt mildly nauseous but no emesis abdominal pain or diarrhea.  In the ED she was found to be having a temperature of 99.3, pulse of 100, respirations of 16.  Further work-up revealed that she tested positive for influenza A.  Is also noted that her creatinine was slightly higher than baseline.  Admitted for her generalized weakness with recurrent falls and likely her generalized weakness worse in the setting of influenza    Discharge Diagnoses:  Principal Problem:    Generalized weakness Active Problems:   Type 2 diabetes mellitus (HCC)   Influenza A   Hyperlipemia   Hypertension  Generalized weakness and physical debility in the setting of influenza A Recurrent falls likely in the setting of suspected hypoglycemic episodes, improved Dizziness, intermittent and stable -She was placed in observation as a telemetry  -She was initiated on IV fluid hydration with LR at 100 MLS per hour for 20 hours which is now expired.  We will renew for another 12 hours but at 75 MLS per hour -Given her recent falls and dizziness head CT scan was obtained which showed no acute intracranial pathology and mild age-related atrophy and chronic microvascular ischemic changes -Given that she has intermittent dizziness and has fallen several times we will also order an MRI for further evaluation to rule out cerebellar causes and MRI of the brain done showed no acute intracranial abnormalities but there was also moderate chronic small vessel ischemic disease and cerebral atrophy noted -We will obtain PT and OT to further evaluate and treat and recommending no follow-up -We will check a TSH in the morning and was normal at 1.672 -We will also obtain orthostatic vital signs and she did not drop -Patient is complaining of some left ear issues so we will also start her on meclizine 12.5 mg p.o. 3 times daily as needed dizziness -Given her influenza A we will initiate Oseltamivir 30 mg po BID and will continue for 4 more days -Also continue Cepacol lozenges 3 mg p.o. as needed.  Sore throat and add flutter valve,  incentive spirometry as well as guaifenesin 1200 mg p.o. twice daily   Diabetes Mellitus Type 2 -Very well controlled with a hemoglobin of 6.0 -Suspect that she is having hypoglycemic episodes as she feels she gets dizzy and has tremors.  States that her blood sugar runs around 75 normally and suspect that she is getting these episodes of dizziness in the setting of hypoglycemia  and falls -We will continue metformin 500 g p.o. twice daily and change her insulin regimen to Basaglar 12 units in the a.m. and 15 units in the evening -We will add sensitive NovoLog SSI AC while hospitalized -Resume insulin at half the dose given concern for her hypoglycemic episodes causing her falls -Monitor CBGs per protocol currently CBGs ranging from 84-1 48   Hyperlipidemia -Continue with atorvastatin 20 mg p.o. daily   Hypertension -Continue home antihypertensives including losartan 100 mg p.o. daily and as well as amlodipine 5 mg daily; will stop hydrochlorothiazide for now -We will continue to monitor blood pressures per protocol -Last blood pressure reading was 121/59 right prior to discharge  Hypokalemia -Patient's K+ was 3.4 -Replete with p.o. Kcl 40 mg twice daily x2 doses -Continue monitor output as necessary -Repeat CMP in outpatient setting   Leukocytosis/neutrophilia -Has a history of elevated WBC which is chronic and has been evaluated in the outpatient setting by hematology -Patient has a history of MGUS and recently saw Dr. Elmo Putt in the outpatient setting back in September -WBC ranged from 12.4-14,000: At the time of discharge her WBC was 12.0 -Her recent myeloma panel showed a stable M protein of 0.3 g/dL which is unchanged -Follow-up with oncology in outpatient setting   CKD stage IIIb -Stable and around at baseline -She had a creatinine back in September which was 1.27; BUN/creatinine here has gone from 20/1.30 -> 21/1.34 -> 20/1.23 -Continue to monitor and trend renal function panel carefully -Avoid nephrotoxic medications, contrast dyes, hypotension and dehydration and renally adjust medications -Repeat CMP as an outpateint.   Obesity -Complicates overall prognosis and care -Estimated body mass index is 33.39 kg/m as calculated from the following:   Height as of this encounter: 4\' 11"  (1.499 m).   Weight as of this encounter: 75 kg.  -Weight Loss and  Dietary Counseling given  Discharge Instructions  Discharge Instructions     Call MD for:  difficulty breathing, headache or visual disturbances   Complete by: As directed    Call MD for:  extreme fatigue   Complete by: As directed    Call MD for:  hives   Complete by: As directed    Call MD for:  persistant dizziness or light-headedness   Complete by: As directed    Call MD for:  persistant nausea and vomiting   Complete by: As directed    Call MD for:  redness, tenderness, or signs of infection (pain, swelling, redness, odor or green/yellow discharge around incision site)   Complete by: As directed    Call MD for:  severe uncontrolled pain   Complete by: As directed    Call MD for:  temperature >100.4   Complete by: As directed    Diet - low sodium heart healthy   Complete by: As directed    Discharge instructions   Complete by: As directed    You were cared for by a hospitalist during your hospital stay. If you have any questions about your discharge medications or the care you received while you were in the hospital after you are  discharged, you can call the unit and ask to speak with the hospitalist on call if the hospitalist that took care of you is not available. Once you are discharged, your primary care physician will handle any further medical issues. Please note that NO REFILLS for any discharge medications will be authorized once you are discharged, as it is imperative that you return to your primary care physician (or establish a relationship with a primary care physician if you do not have one) for your aftercare needs so that they can reassess your need for medications and monitor your lab values.  Follow up with PCP and Endocrinology if necessary. If continues to be dizzy will need to follow up with Neurology in the outpatient setting. Take all medications as prescribed. If symptoms change or worsen please return to the ED for evaluation   Increase activity slowly    Complete by: As directed       Allergies as of 06/27/2021       Reactions   Celebrex [celecoxib] Rash   Feldene [piroxicam] Swelling   Voltaren [diclofenac Sodium] Rash        Medication List     STOP taking these medications    losartan-hydrochlorothiazide 100-12.5 MG tablet Commonly known as: HYZAAR       TAKE these medications    acetaminophen 500 MG tablet Commonly known as: TYLENOL Take 500 mg by mouth every 6 (six) hours as needed (Pt does not take unless severe pain.).   amLODipine 5 MG tablet Commonly known as: NORVASC Take 5 mg by mouth daily.   atorvastatin 20 MG tablet Commonly known as: LIPITOR Take 20 mg by mouth daily.   Basaglar KwikPen 100 UNIT/ML Inject 12-15 Units into the skin 2 (two) times daily. 12 units in the AM and 15 units in the evening What changed:  how much to take additional instructions   Fish Oil 1000 MG Caps Take 1,000 mg by mouth daily.   guaiFENesin 600 MG 12 hr tablet Commonly known as: MUCINEX Take 1 tablet (600 mg total) by mouth 2 (two) times daily for 5 days.   losartan 100 MG tablet Commonly known as: COZAAR Take 1 tablet (100 mg total) by mouth daily. Start taking on: June 28, 2021   menthol-cetylpyridinium 3 MG lozenge Commonly known as: CEPACOL Take 1 lozenge (3 mg total) by mouth as needed for sore throat.   metFORMIN 500 MG tablet Commonly known as: GLUCOPHAGE Take 500 mg by mouth 2 (two) times daily with a meal.   MULTI-VITAMIN DAILY PO Take 1 tablet by mouth daily.   ondansetron 4 MG tablet Commonly known as: ZOFRAN Take 1 tablet (4 mg total) by mouth every 6 (six) hours as needed for nausea.   oseltamivir 30 MG capsule Commonly known as: TAMIFLU Take 1 capsule (30 mg total) by mouth 2 (two) times daily.   TRULICITY [place] Inject 0.5 Units into the skin once a week.        Allergies  Allergen Reactions   Celebrex [Celecoxib] Rash   Feldene [Piroxicam] Swelling   Voltaren [Diclofenac  Sodium] Rash   Consultations: Diabetes Education Coordinatior  Procedures/Studies: CT Head Wo Contrast  Result Date: 06/24/2021 CLINICAL DATA:  Head trauma. EXAM: CT HEAD WITHOUT CONTRAST TECHNIQUE: Contiguous axial images were obtained from the base of the skull through the vertex without intravenous contrast. COMPARISON:  None. FINDINGS: Brain: Mild age-related atrophy and moderate chronic microvascular ischemic changes. There is no acute intracranial hemorrhage. No mass effect or  midline shift no extra-axial fluid collection. Vascular: No hyperdense vessel or unexpected calcification. Skull: Normal. Negative for fracture or focal lesion. Sinuses/Orbits: No acute finding. Other: None IMPRESSION: 1. No acute intracranial pathology. 2. Mild age-related atrophy and chronic microvascular ischemic changes. Electronically Signed   By: Anner Crete M.D.   On: 06/24/2021 21:57   MR BRAIN WO CONTRAST  Result Date: 06/26/2021 CLINICAL DATA:  Dizziness, persistent/recurrent, cardiac or vascular cause suspected. EXAM: MRI HEAD WITHOUT CONTRAST TECHNIQUE: Multiplanar, multiecho pulse sequences of the brain and surrounding structures were obtained without intravenous contrast. COMPARISON:  Head CT 06/24/2021 FINDINGS: Brain: There is no evidence of an acute infarct, intracranial hemorrhage, mass, midline shift, or extra-axial fluid collection. Patchy T2 hyperintensities in the cerebral white matter nonspecific but compatible with moderate chronic small vessel ischemic disease. There is mild generalized cerebral atrophy. The posterior pituitary bright spot is prominent with normal overall size of the pituitary gland. Vascular: Major intracranial vascular flow voids are preserved. Skull and upper cervical spine: Unremarkable bone marrow signal para Sinuses/Orbits: Bilateral cataract extraction. Paranasal sinuses and mastoid air cells are clear. Other: None. IMPRESSION: 1. No acute intracranial abnormality. 2.  Moderate chronic small vessel ischemic disease. 3.  Cerebral Atrophy (ICD10-G31.9). Electronically Signed   By: Logan Bores M.D.   On: 06/26/2021 11:56     Subjective: Seen and examined at bedside and she was doing well and was back to her baseline.  Denies chest pain or shortness of breath.  Ambulated without issues.  Did not have any dizziness when she had repeat orthostatic vital signs done.  She was deemed medically stable to be discharged to follow-up with PCP given her insulin adjustments and further insulin adjustments to be made by her PCP.  Discharge Exam: Vitals:   06/27/21 1153 06/27/21 1337  BP:  (!) 121/59  Pulse:  94  Resp:  16  Temp:  98.8 F (37.1 C)  SpO2: 97% 96%   Vitals:   06/26/21 2006 06/27/21 0447 06/27/21 1153 06/27/21 1337  BP: 122/62 127/62  (!) 121/59  Pulse: 92 80  94  Resp: 18 17  16   Temp: 98.9 F (37.2 C)   98.8 F (37.1 C)  TempSrc: Oral   Oral  SpO2: 94% 95% 97% 96%  Weight:      Height:       General: Pt is alert, awake, not in acute distress Cardiovascular: RRR, S1/S2 +, no rubs, no gallops Respiratory: Diminished bilaterally, no wheezing, no rhonchi; unlabored breathing but has some coarse breath sounds Abdominal: Soft, NT, distended secondary body habitus, bowel sounds + Extremities: no edema, no cyanosis  The results of significant diagnostics from this hospitalization (including imaging, microbiology, ancillary and laboratory) are listed below for reference.    Microbiology: Recent Results (from the past 240 hour(s))  Resp Panel by RT-PCR (Flu A&B, Covid) Nasopharyngeal Swab     Status: Abnormal   Collection Time: 06/24/21 10:55 PM   Specimen: Nasopharyngeal Swab; Nasopharyngeal(NP) swabs in vial transport medium  Result Value Ref Range Status   SARS Coronavirus 2 by RT PCR NEGATIVE NEGATIVE Final    Comment: (NOTE) SARS-CoV-2 target nucleic acids are NOT DETECTED.  The SARS-CoV-2 RNA is generally detectable in upper  respiratory specimens during the acute phase of infection. The lowest concentration of SARS-CoV-2 viral copies this assay can detect is 138 copies/mL. A negative result does not preclude SARS-Cov-2 infection and should not be used as the sole basis for treatment or other patient management decisions.  A negative result may occur with  improper specimen collection/handling, submission of specimen other than nasopharyngeal swab, presence of viral mutation(s) within the areas targeted by this assay, and inadequate number of viral copies(<138 copies/mL). A negative result must be combined with clinical observations, patient history, and epidemiological information. The expected result is Negative.  Fact Sheet for Patients:  EntrepreneurPulse.com.au  Fact Sheet for Healthcare Providers:  IncredibleEmployment.be  This test is no t yet approved or cleared by the [place] FDA and  has been authorized for detection and/or diagnosis of SARS-CoV-2 by FDA under an Emergency Use Authorization (EUA). This EUA will remain  in effect (meaning this test can be used) for the duration of the COVID-19 declaration under Section 564(b)(1) of the Act, 21 U.S.C.section 360bbb-3(b)(1), unless the authorization is terminated  or revoked sooner.       Influenza A by PCR POSITIVE (A) NEGATIVE Final   Influenza B by PCR NEGATIVE NEGATIVE Final    Comment: (NOTE) The Xpert Xpress SARS-CoV-2/FLU/RSV plus assay is intended as an aid in the diagnosis of influenza from Nasopharyngeal swab specimens and should not be used as a sole basis for treatment. Nasal washings and aspirates are unacceptable for Xpert Xpress SARS-CoV-2/FLU/RSV testing.  Fact Sheet for Patients: EntrepreneurPulse.com.au  Fact Sheet for Healthcare Providers: IncredibleEmployment.be  This test is not yet approved or cleared by the [place] FDA and has been  authorized for detection and/or diagnosis of SARS-CoV-2 by FDA under an Emergency Use Authorization (EUA). This EUA will remain in effect (meaning this test can be used) for the duration of the COVID-19 declaration under Section 564(b)(1) of the Act, 21 U.S.C. section 360bbb-3(b)(1), unless the authorization is terminated or revoked.  Performed at [facility], [address] [place],  [address]      Labs: BNP (last 3 results) No results for input(s): BNP in the last 8760 hours. Basic Metabolic Panel: Recent Labs  Lab 06/24/21 2234 06/26/21 0516 06/27/21 0700  NA 138 137 139  K 3.8 3.5 3.4*  CL 101 103 105  CO2 27 25 26   GLUCOSE 103* 104* 79  BUN 20 21 20   CREATININE 1.30* 1.34* 1.23*  CALCIUM 9.2 8.8* 8.5*  MG  --   --  2.0  PHOS  --   --  3.5   Liver Function Tests: Recent Labs  Lab 06/26/21 0516 06/27/21 0700  AST 28 23  ALT 24 22  ALKPHOS 55 50  BILITOT 0.8 1.1  PROT 7.3 7.0  ALBUMIN 3.7 3.6   No results for input(s): LIPASE, AMYLASE in the last 168 hours. No results for input(s): AMMONIA in the last 168 hours. CBC: Recent Labs  Lab 06/24/21 2234 06/26/21 0516 06/27/21 0700  WBC 12.0* 14.3* 12.0*  NEUTROABS 8.6*  --  6.9  HGB 12.9 13.5 12.3  HCT 39.3 41.2 37.9  MCV 88.7 89.8 89.8  PLT 253 248 234   Cardiac Enzymes: No results for input(s): CKTOTAL, CKMB, CKMBINDEX, TROPONINI in the last 168 hours. BNP: Invalid input(s): POCBNP CBG: Recent Labs  Lab 06/26/21 1644 06/26/21 2102 06/27/21 0732 06/27/21 1151 06/27/21 1623  GLUCAP 98 113* 84 99 88   D-Dimer No results for input(s): DDIMER in the last 72 hours. Hgb A1c Recent Labs    06/26/21 0516  HGBA1C 6.0*   Lipid Profile No results for input(s): CHOL, HDL, LDLCALC, TRIG, CHOLHDL, LDLDIRECT in the last 72 hours. Thyroid function studies Recent Labs    06/27/21 0700  TSH 1.672  Anemia work up Recent Labs    06/27/21 0700  VITAMINB12 157*    Urinalysis    Component Value Date/Time   COLORURINE YELLOW 06/24/2021 2151   APPEARANCEUR CLEAR 06/24/2021 2151   LABSPEC 1.012 06/24/2021 2151   [place] 5.5 06/24/2021 2151   [place] 06/24/2021 2151   HGBUR NEGATIVE 06/24/2021 2151   BILIRUBINUR NEGATIVE 06/24/2021 2151   KETONESUR NEGATIVE 06/24/2021 2151   PROTEINUR NEGATIVE 06/24/2021 2151   NITRITE NEGATIVE 06/24/2021 2151   LEUKOCYTESUR NEGATIVE 06/24/2021 2151   Sepsis Labs Invalid input(s): PROCALCITONIN,  WBC,  LACTICIDVEN Microbiology Recent Results (from the past 240 hour(s))  Resp Panel by RT-PCR (Flu A&B, Covid) Nasopharyngeal Swab     Status: Abnormal   Collection Time: 06/24/21 10:55 PM   Specimen: Nasopharyngeal Swab; Nasopharyngeal(NP) swabs in vial transport medium  Result Value Ref Range Status   SARS Coronavirus 2 by RT PCR NEGATIVE NEGATIVE Final    Comment: (NOTE) SARS-CoV-2 target nucleic acids are NOT DETECTED.  The SARS-CoV-2 RNA is generally detectable in upper respiratory specimens during the acute phase of infection. The lowest concentration of SARS-CoV-2 viral copies this assay can detect is 138 copies/mL. A negative result does not preclude SARS-Cov-2 infection and should not be used as the sole basis for treatment or other patient management decisions. A negative result may occur with  improper specimen collection/handling, submission of specimen other than nasopharyngeal swab, presence of viral mutation(s) within the areas targeted by this assay, and inadequate number of viral copies(<138 copies/mL). A negative result must be combined with clinical observations, patient history, and epidemiological information. The expected result is Negative.  Fact Sheet for Patients:  EntrepreneurPulse.com.au  Fact Sheet for Healthcare Providers:  IncredibleEmployment.be  This test is no t yet approved or cleared by the [place] FDA and  has been  authorized for detection and/or diagnosis of SARS-CoV-2 by FDA under an Emergency Use Authorization (EUA). This EUA will remain  in effect (meaning this test can be used) for the duration of the COVID-19 declaration under Section 564(b)(1) of the Act, 21 U.S.C.section 360bbb-3(b)(1), unless the authorization is terminated  or revoked sooner.       Influenza A by PCR POSITIVE (A) NEGATIVE Final   Influenza B by PCR NEGATIVE NEGATIVE Final    Comment: (NOTE) The Xpert Xpress SARS-CoV-2/FLU/RSV plus assay is intended as an aid in the diagnosis of influenza from Nasopharyngeal swab specimens and should not be used as a sole basis for treatment. Nasal washings and aspirates are unacceptable for Xpert Xpress SARS-CoV-2/FLU/RSV testing.  Fact Sheet for Patients: EntrepreneurPulse.com.au  Fact Sheet for Healthcare Providers: IncredibleEmployment.be  This test is not yet approved or cleared by the [place] FDA and has been authorized for detection and/or diagnosis of SARS-CoV-2 by FDA under an Emergency Use Authorization (EUA). This EUA will remain in effect (meaning this test can be used) for the duration of the COVID-19 declaration under Section 564(b)(1) of the Act, 21 U.S.C. section 360bbb-3(b)(1), unless the authorization is terminated or revoked.  Performed at [facility], [address] [place], [place] [address]    Time coordinating discharge: 35 minutes  SIGNED:  Kerney Elbe, DO [facility] 06/27/2021, 6:23 PM Pager is on [place]  If 7PM-7AM, please contact night-coverage [email or web address]

## 2021-06-27 NOTE — Evaluation (Signed)
Physical Therapy Evaluation Patient Details Name: Yesenia Meyers MRN: [id number] DOB: Aug 08, 1950 Today's Date: 06/27/2021  History of Present Illness  Pt is 70 yo female admitted 06/24/21 with generalized weakness and found to have flu.  Pt with history including but not limited to  atypical chest pain, anxiety and depression, type 2 diabetes mellitus which is insulin-dependent, hypertension  Clinical Impression   Pt admitted with above diagnosis.  At baseline, she is independent with ADLs/IADLs and ambulates with cane in community.  Pt has had hx of falls in last year but reports typically when she is reaching/doing task.  Today, pt able to transfer supervision/independently and ambulated 200' with RW without difficulty.  Pt reports feels that she is at her baseline.  No further acute PT required.        Recommendations for follow up therapy are one component of a multi-disciplinary discharge planning process, led by the attending physician.  Recommendations may be updated based on patient status, additional functional criteria and insurance authorization.  Follow Up Recommendations No PT follow up    Assistance Recommended at Discharge PRN  Functional Status Assessment Patient has not had a recent decline in their functional status  Equipment Recommendations       Recommendations for Other Services       Precautions / Restrictions Precautions Precautions: Fall      Mobility  Bed Mobility Overal bed mobility: Independent                  Transfers Overall transfer level: Needs assistance Equipment used: Rolling walker (2 wheels) Transfers: Sit to/from Stand Sit to Stand: Supervision           General transfer comment: Supervision for safety; performed sit to stand from bed and toilet; toielting ADLs independently    Ambulation/Gait Ambulation/Gait assistance: Supervision Gait Distance (Feet): 200 Feet Assistive device: Rolling walker (2 wheels) Gait  Pattern/deviations: Step-through pattern;Decreased stride length Gait velocity: normal     General Gait Details: steady gait without LOB  Stairs            Wheelchair Mobility    Modified Rankin (Stroke Patients Only)       Balance Overall balance assessment: Needs assistance Sitting-balance support: No upper extremity supported Sitting balance-Leahy Scale: Normal     Standing balance support: No upper extremity supported;Bilateral upper extremity supported Standing balance-Leahy Scale: Fair Standing balance comment: RW to ambulate but could static stand/ADLs wtihout support                             Pertinent Vitals/Pain Pain Assessment: No/denies pain    Home Living Family/patient expects to be discharged to:: Private residence Living Arrangements: Alone   Type of Home: House Home Access: Stairs to enter Entrance Stairs-Rails: Can reach both Entrance Stairs-Number of Steps: 4   Home Layout: One level Home Equipment: Rollator (4 wheels);Tub bench;Grab bars - tub/shower Additional Comments: Pt moving to [facility] in January    Prior Function Prior Level of Function : Independent/Modified Independent             Mobility Comments: Could ambulate in community with cane; furniture surfs at home; reports a few falls in past year.  Falls occurred with reaching or doing task , not walking ADLs Comments: Independent with ADLs and IADLs     Hand Dominance   Dominant Hand: Right    Extremity/Trunk Assessment   Upper Extremity Assessment Upper Extremity  Assessment: Defer to OT evaluation    Lower Extremity Assessment Lower Extremity Assessment: Overall WFL for tasks assessed (ROM WFL: MMT 5/5)    Cervical / Trunk Assessment Cervical / Trunk Assessment: Normal  Communication   Communication: No difficulties  Cognition Arousal/Alertness: Awake/alert Behavior During Therapy: WFL for tasks assessed/performed Overall Cognitive Status:  Within Functional Limits for tasks assessed                                          General Comments General comments (skin integrity, edema, etc.): Pt reports feeling back to baseline.    Exercises     Assessment/Plan    PT Assessment Patient does not need any further PT services  PT Problem List         PT Treatment Interventions      PT Goals (Current goals can be found in the Care Plan section)  Acute Rehab PT Goals Patient Stated Goal: return home PT Goal Formulation: All assessment and education complete, DC therapy    Frequency     Barriers to discharge        Co-evaluation PT/OT/SLP Co-Evaluation/Treatment: Yes Reason for Co-Treatment: For patient/therapist safety (hx recent falls) PT goals addressed during session: Mobility/safety with mobility OT goals addressed during session: ADL's and self-care       AM-PAC PT "6 Clicks" Mobility  Outcome Measure Help needed turning from your back to your side while in a flat bed without using bedrails?: None Help needed moving from lying on your back to sitting on the side of a flat bed without using bedrails?: None Help needed moving to and from a bed to a chair (including a wheelchair)?: A Little Help needed standing up from a chair using your arms (e.g., wheelchair or bedside chair)?: A Little Help needed to walk in hospital room?: A Little Help needed climbing 3-5 steps with a railing? : A Little 6 Click Score: 20    End of Session   Activity Tolerance: Patient tolerated treatment well Patient left: in bed;with call bell/phone within reach;with bed alarm set Nurse Communication: Mobility status PT Visit Diagnosis: Other abnormalities of gait and mobility (R26.89)    Time: 4888-9169 PT Time Calculation (min) (ACUTE ONLY): 17 min   Charges:   PT Evaluation $PT Eval Low Complexity: 1 Low          Aiyanna Awtrey, PT Acute Rehab Services Pager [phone] [facility]  [phone]   Karlton Lemon 06/27/2021, 12:01 PM

## 2021-06-30 LAB — VITAMIN B1: Vitamin B1 (Thiamine): 114.4 nmol/L (ref 66.5–200.0)

## 2022-01-21 ENCOUNTER — Telehealth: Payer: Self-pay | Admitting: Hematology

## 2022-01-21 NOTE — Telephone Encounter (Signed)
Left message with rescheduled upcoming appointment due to provider's template. 

## 2022-04-02 ENCOUNTER — Other Ambulatory Visit: Payer: Self-pay

## 2022-04-02 DIAGNOSIS — D472 Monoclonal gammopathy: Secondary | ICD-10-CM

## 2022-04-06 ENCOUNTER — Inpatient Hospital Stay: Payer: PPO | Admitting: Hematology

## 2022-04-06 ENCOUNTER — Other Ambulatory Visit: Payer: Self-pay

## 2022-04-06 ENCOUNTER — Inpatient Hospital Stay: Payer: PPO | Attending: Hematology

## 2022-04-06 VITALS — BP 144/60 | HR 88 | Temp 97.8°F | Resp 17 | Ht 59.0 in | Wt 162.9 lb

## 2022-04-06 DIAGNOSIS — D472 Monoclonal gammopathy: Secondary | ICD-10-CM | POA: Diagnosis not present

## 2022-04-06 DIAGNOSIS — D72829 Elevated white blood cell count, unspecified: Secondary | ICD-10-CM | POA: Insufficient documentation

## 2022-04-06 LAB — CMP (CANCER CENTER ONLY)
ALT: 13 U/L (ref 0–44)
AST: 15 U/L (ref 15–41)
Albumin: 4.3 g/dL (ref 3.5–5.0)
Alkaline Phosphatase: 57 U/L (ref 38–126)
Anion gap: 8 (ref 5–15)
BUN: 25 mg/dL — ABNORMAL HIGH (ref 8–23)
CO2: 27 mmol/L (ref 22–32)
Calcium: 9.3 mg/dL (ref 8.9–10.3)
Chloride: 105 mmol/L (ref 98–111)
Creatinine: 1.18 mg/dL — ABNORMAL HIGH (ref 0.44–1.00)
GFR, Estimated: 49 mL/min — ABNORMAL LOW (ref >60)
Glucose, Bld: 107 mg/dL — ABNORMAL HIGH (ref 70–99)
Potassium: 3.8 mmol/L (ref 3.5–5.1)
Sodium: 140 mmol/L (ref 135–145)
Total Bilirubin: 0.6 mg/dL (ref 0.3–1.2)
Total Protein: 7.7 g/dL (ref 6.5–8.1)

## 2022-04-06 LAB — CBC WITH DIFFERENTIAL (CANCER CENTER ONLY)
Abs Immature Granulocytes: 0.04 10*3/uL (ref 0.00–0.07)
Basophils Absolute: 0.1 10*3/uL (ref 0.0–0.1)
Basophils Relative: 1 %
Eosinophils Absolute: 0.3 10*3/uL (ref 0.0–0.5)
Eosinophils Relative: 2 %
HCT: 38.8 % (ref 36.0–46.0)
Hemoglobin: 13 g/dL (ref 12.0–15.0)
Immature Granulocytes: 0 %
Lymphocytes Relative: 29 %
Lymphs Abs: 3.9 10*3/uL (ref 0.7–4.0)
MCH: 29.9 pg (ref 26.0–34.0)
MCHC: 33.5 g/dL (ref 30.0–36.0)
MCV: 89.2 fL (ref 80.0–100.0)
Monocytes Absolute: 1 10*3/uL (ref 0.1–1.0)
Monocytes Relative: 7 %
Neutro Abs: 8.1 10*3/uL — ABNORMAL HIGH (ref 1.7–7.7)
Neutrophils Relative %: 61 %
Platelet Count: 272 10*3/uL (ref 150–400)
RBC: 4.35 MIL/uL (ref 3.87–5.11)
RDW: 13 % (ref 11.5–15.5)
WBC Count: 13.4 10*3/uL — ABNORMAL HIGH (ref 4.0–10.5)
nRBC: 0 % (ref 0.0–0.2)

## 2022-04-06 LAB — LACTATE DEHYDROGENASE: LDH: 133 U/L (ref 98–192)

## 2022-04-06 NOTE — Progress Notes (Signed)
HEMATOLOGY/ONCOLOGY CLINIC NOTE  Date of Service: 04/06/2022  Patient Care Team: Leighton Ruff, MD (Inactive) as PCP - General (Family Medicine)  CHIEF COMPLAINTS/PURPOSE OF CONSULTATION:  Leucocytosis MGUS  HISTORY OF PRESENTING ILLNESS:   Yesenia Meyers is a wonderful 71 y.o. female who has been referred to [place] by Dr ..Leighton Ruff, MD (Inactive)  for evaluation of leucocytosis.  Patient has a h/o DM2, IDA, HTN and notes severe rt shoulder pain and decreased ROM since Oct 2017 when she had a fall with trauma to the rt shoulder. She notes that she has been scheduled for surgery soon and is very keen to proceed with surgery.  Her CBC done with her PCP show mild leucocytosis of 12.4-14k from atleast 03/09/2016 with predominantly neutrophilia with ANC 7.7-8.9k. With borderline stable lymphocytosis with lymph # of around 3.8k and borderline monocytosis 1-1.1k.  Her hgb/hct and platelet counts are WNL.  She currently notes rt shoulder pain. No fevers/chills/night sweats/unexplained weight loss/enlarged LN or other overt new focal symptoms.  She notes that she did have a bad dental/gum infection and required antibiotics. No symptoms suggestive of another overt source of infection Denies being on steroids recently.  INTERVAL HISTORY SHANDREKA DANTE is a 71 y.o. 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 She reports She is doing well with no new symptoms or concerns.  She reports she got the flu shot 2 weeks ago.  No fever, chills, night sweats. No new lumps, bumps, or lesions/rashes. No abdominal pain or change in bowel habits. No new fatigue. No new focal bone pains. No new or unexpected weight loss. No new infection issues. No other new or acute focal symptoms.  Labs done today were reviewed in detail.  MEDICAL HISTORY:  Past Medical History:  Diagnosis Date   Atypical chest  pain    Related to anxiety    Diabetes mellitus without complication (HCC)    DOE (dyspnea on exertion)    Hyperlipemia    Hypertension   iron deficiency Anemia  SURGICAL HISTORY: Past Surgical History:  Procedure Laterality Date   BACK SURGERY  1986   Crushed 2 discs    BACK SURGERY  2004   Spinal stenosis   CARPAL TUNNEL RELEASE Bilateral    KNEE SURGERY Left    71 y.o.   PARTIAL HYSTERECTOMY N/A    Age 27   TUBAL LIGATION  1974    SOCIAL HISTORY: Social History   Socioeconomic History   Marital status: Divorced    Spouse name: Not on file   Number of children: Not on file   Years of education: Not on file   Highest education level: Not on file  Occupational History   Not on file  Tobacco Use   Smoking status: Never   Smokeless tobacco: Never  Vaping Use   Vaping Use: Never used  Substance and Sexual Activity   Alcohol use: No   Drug use: No   Sexual activity: Yes  Other Topics Concern   Not on file  Social History Narrative   Not on file   Social Determinants of Health   Financial Resource Strain: Not on file  Food Insecurity: Not on file  Transportation Needs: Not on file  Physical Activity: Not on file  Stress: Not on file  Social Connections: Not on file  Intimate Partner Violence: Not on file    FAMILY HISTORY:  Family History  Problem Relation Age of Onset   Heart disease Mother        24's   Diabetes Mother    Hypertension Mother    Heart disease Father        83's   Diabetes Father    Hypertension Father    Diabetes Sister    Diabetes Brother    Cancer Brother        Colon   Diabetes Brother     ALLERGIES:  is allergic to celebrex [celecoxib], feldene [piroxicam], and voltaren [diclofenac sodium].  MEDICATIONS:  Current Outpatient Medications  Medication Sig Dispense Refill   acetaminophen (TYLENOL) 500 MG tablet Take 500 mg by mouth every 6 (six) hours as needed (Pt does not take unless severe pain.).      amLODipine  (NORVASC) 5 MG tablet Take 5 mg by mouth daily.     atorvastatin (LIPITOR) 20 MG tablet Take 20 mg by mouth daily.     Dulaglutide (TRULICITY [place]) Inject 0.5 Units into the skin once a week.     Insulin Glargine (BASAGLAR KWIKPEN) 100 UNIT/ML Inject 12-15 Units into the skin 2 (two) times daily. 12 units in the AM and 15 units in the evening     losartan (COZAAR) 100 MG tablet Take 1 tablet (100 mg total) by mouth daily. 30 tablet 0   menthol-cetylpyridinium (CEPACOL) 3 MG lozenge Take 1 lozenge (3 mg total) by mouth as needed for sore throat. 100 tablet 0   metFORMIN (GLUCOPHAGE) 500 MG tablet Take 500 mg by mouth 2 (two) times daily with a meal.     Multiple Vitamin (MULTI-VITAMIN DAILY PO) Take 1 tablet by mouth daily.     Omega-3 Fatty Acids (FISH OIL) 1000 MG CAPS Take 1,000 mg by mouth daily.     ondansetron (ZOFRAN) 4 MG tablet Take 1 tablet (4 mg total) by mouth every 6 (six) hours as needed for nausea. 20 tablet 0   oseltamivir (TAMIFLU) 30 MG capsule Take 1 capsule (30 mg total) by mouth 2 (two) times daily. 7 capsule 0   No current facility-administered medications for this visit.    REVIEW OF SYSTEMS:   .10 Point review of Systems was done is negative except as noted above.  PHYSICAL EXAMINATION: ECOG PERFORMANCE STATUS: 1 - Symptomatic but completely ambulatory  . Vitals:   04/06/22 1407  BP: (!) 144/60  Pulse: 88  Resp: 17  Temp: 97.8 F (36.6 C)  SpO2: 98%   Filed Weights   04/06/22 1407  Weight: 162 lb 14.4 oz (73.9 kg)   .Body mass index is 32.9 kg/m.   NAD GENERAL:alert, in no acute distress and comfortable SKIN: no acute rashes, no significant lesions EYES: conjunctiva are pink and non-injected, sclera anicteric NECK: supple, no JVD LYMPH:  no palpable lymphadenopathy in the cervical, axillary or inguinal regions LUNGS: clear to auscultation b/l with normal respiratory effort HEART: regular rate & rhythm ABDOMEN:  normoactive bowel sounds , non tender,  not distended. Extremity: no pedal edema PSYCH: alert & oriented x 3 with fluent speech NEURO: no focal motor/sensory deficits  LABORATORY DATA:  I have reviewed the data as listed  .    Latest Ref Rng & Units 04/06/2022   12:59 PM 06/27/2021    7:00 AM 06/26/2021    5:16 AM  CBC  WBC 4.0 - 10.5 K/uL 13.4  12.0  14.3   Hemoglobin 12.0 - 15.0 g/dL 13.0  12.3  13.5   Hematocrit  36.0 - 46.0 % 38.8  37.9  41.2   Platelets 150 - 400 K/uL 272  234  248    . CBC    Component Value Date/Time   WBC 13.4 (H) 04/06/2022 1259   WBC 12.0 (H) 06/27/2021 0700   RBC 4.35 04/06/2022 1259   HGB 13.0 04/06/2022 1259   HGB 15.1 01/06/2017 1611   HCT 38.8 04/06/2022 1259   HCT 44.6 01/06/2017 1611   PLT 272 04/06/2022 1259   PLT 298 01/06/2017 1611   MCV 89.2 04/06/2022 1259   MCV 90.7 01/06/2017 1611   MCH 29.9 04/06/2022 1259   MCHC 33.5 04/06/2022 1259   RDW 13.0 04/06/2022 1259   RDW 12.6 01/06/2017 1611   LYMPHSABS 3.9 04/06/2022 1259   LYMPHSABS 4.1 (H) 01/06/2017 1611   MONOABS 1.0 04/06/2022 1259   MONOABS 1.2 (H) 01/06/2017 1611   EOSABS 0.3 04/06/2022 1259   EOSABS 0.2 01/06/2017 1611   BASOSABS 0.1 04/06/2022 1259   BASOSABS 0.0 01/06/2017 1611   ANC 8.1k .    Latest Ref Rng & Units 04/06/2022   12:59 PM 06/27/2021    7:00 AM 06/26/2021    5:16 AM  CMP  Glucose 70 - 99 mg/dL 107  79  104   BUN 8 - 23 mg/dL '25  20  21   '$ Creatinine 0.44 - 1.00 mg/dL 1.18  1.23  1.34   Sodium 135 - 145 mmol/L 140  139  137   Potassium 3.5 - 5.1 mmol/L 3.8  3.4  3.5   Chloride 98 - 111 mmol/L 105  105  103   CO2 22 - 32 mmol/L '27  26  25   '$ Calcium 8.9 - 10.3 mg/dL 9.3  8.5  8.8   Total Protein 6.5 - 8.1 g/dL 7.7  7.0  7.3   Total Bilirubin 0.3 - 1.2 mg/dL 0.6  1.1  0.8   Alkaline Phos 38 - 126 U/L 57  50  55   AST 15 - 41 U/L '15  23  28   '$ ALT 0 - 44 U/L '13  22  24          '$ RADIOGRAPHIC STUDIES: I have personally reviewed the radiological images as listed and agreed with the  findings in the report. No results found.  ASSESSMENT & PLAN:   71 yo with incidentally noted leucocytosis on labs  1) Chronic persistent Neutrophilia  Present since at least 02/2016. No significant blasts or left shift on peripheral blood smear.  LDH WNL Sed rate WNL Appears likely reactive ? Uncontrolled rt shoulder pain. R/o inflammation in rt shoulder. Did have issues with dental /periodontal infection in march 2018. No other overt source of infection No palpable hepato-splenomegaly or LNadenopathy to suggest lymphoproliferative or myeloproliferative process.  2) Lymphocytosis - non clonal on flow cytometry  3) Mild monocytosis.  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 Likely MGUS.  PLAN -Discussed pt labwork today, 04/06/2022 Lab results show CBC w/diff chronic leukocytosis with a WBC count of 13.4k with neutrophilia ANC 8.1k CMP is unremarkable other than a borderline elevated creatinine of 1.18. LDH is 133. Myeloma panel shows a stable M protein of 0.4 g/dL. K/L Light Chains stable. -Leukocytosis remains steady, continues to indicate a reactive process.  -Will see the pt in 12 months with labs    FOLLOW UP: RTC with Dr Irene Limbo with labs in  12 months  . The total time spent in the appointment was 20 minutes and more than 50% was on counseling and direct patient cares.   All of the patient's questions were answered with apparent satisfaction. The patient knows to call the clinic with any problems, questions or concerns.   Sullivan Lone MD MS AAHIVMS [facility] Hematology/Oncology Physician [facility]  I, Melene Muller, am acting as scribe for Dr. Sullivan Lone, MD.  .I have reviewed the above documentation for accuracy and completeness, and I agree with the above. Brunetta Genera MD

## 2022-04-07 LAB — KAPPA/LAMBDA LIGHT CHAINS
Kappa free light chain: 74.1 mg/L — ABNORMAL HIGH (ref 3.3–19.4)
Kappa, lambda light chain ratio: 3.09 — ABNORMAL HIGH (ref 0.26–1.65)
Lambda free light chains: 24 mg/L (ref 5.7–26.3)

## 2022-04-08 ENCOUNTER — Other Ambulatory Visit: Payer: PPO

## 2022-04-08 ENCOUNTER — Ambulatory Visit: Payer: PPO | Admitting: Hematology

## 2022-04-09 LAB — MULTIPLE MYELOMA PANEL, SERUM
Albumin SerPl Elph-Mcnc: 3.7 g/dL (ref 2.9–4.4)
Albumin/Glob SerPl: 1.2 (ref 0.7–1.7)
Alpha 1: 0.3 g/dL (ref 0.0–0.4)
Alpha2 Glob SerPl Elph-Mcnc: 1 g/dL (ref 0.4–1.0)
B-Globulin SerPl Elph-Mcnc: 1.3 g/dL (ref 0.7–1.3)
Gamma Glob SerPl Elph-Mcnc: 0.7 g/dL (ref 0.4–1.8)
Globulin, Total: 3.3 g/dL (ref 2.2–3.9)
IgA: 502 mg/dL — ABNORMAL HIGH (ref 64–422)
IgG (Immunoglobin G), Serum: 792 mg/dL (ref 586–1602)
IgM (Immunoglobulin M), Srm: 126 mg/dL (ref 26–217)
M Protein SerPl Elph-Mcnc: 0.4 g/dL — ABNORMAL HIGH
Total Protein ELP: 7 g/dL (ref 6.0–8.5)

## 2022-05-17 ENCOUNTER — Ambulatory Visit (AMBULATORY_SURGERY_CENTER): Payer: PPO | Admitting: *Deleted

## 2022-05-17 VITALS — Ht 59.5 in | Wt 159.0 lb

## 2022-05-17 DIAGNOSIS — Z1211 Encounter for screening for malignant neoplasm of colon: Secondary | ICD-10-CM

## 2022-05-17 MED ORDER — SUTAB 1479-225-188 MG PO TABS: ORAL_TABLET | ORAL | 0 refills | Status: DC

## 2022-05-17 MED ORDER — PEG 3350-KCL-NA BICARB-NACL 420 G PO SOLR: 4000.0000 mL | Freq: Once | ORAL | 0 refills | Status: AC

## 2022-05-17 NOTE — Progress Notes (Signed)
No egg or soy allergy known to patient  No issues known to pt with past sedation with any surgeries or procedures Patient denies ever being told they had issues or difficulty with intubation  No FH of Malignant Hyperthermia Pt is not on diet pills Pt is not on  home 02  Pt is not on blood thinners  Pt denies issues with constipation  No A fib or A flutter Have any cardiac testing pending--NO Pt instructed to use Singlecare.com or GoodRx for a price reduction on prep    Pt. Has allergy to voltren when rx sent in for golytely flag for contraindication,pt.requested sutab,explained to pt. That she still have to frink and pills can not be broken aslo explained about about cost,instructions sent for sutab and miralax,pt.aware about contraindications and that I will be sending multiple instructions,encouraged pt to call if have any questions or concerns.

## 2022-06-09 ENCOUNTER — Encounter: Payer: PPO | Admitting: Gastroenterology

## 2022-12-31 ENCOUNTER — Other Ambulatory Visit: Payer: Self-pay | Admitting: Family Medicine

## 2022-12-31 DIAGNOSIS — R1310 Dysphagia, unspecified: Secondary | ICD-10-CM

## 2023-01-10 ENCOUNTER — Other Ambulatory Visit: Payer: PPO

## 2023-01-17 ENCOUNTER — Ambulatory Visit
Admission: RE | Admit: 2023-01-17 | Discharge: 2023-01-17 | Disposition: A | Discharge status: Home / Self Care | Payer: PPO | Source: Ambulatory Visit | Attending: Family Medicine | Admitting: Family Medicine

## 2023-01-17 DIAGNOSIS — R1310 Dysphagia, unspecified: Secondary | ICD-10-CM

## 2023-08-16 IMAGING — MR MR HEAD W/O CM
11 series · 48 of 48 positions shown · non-contrast
Comparison: Head CT 06/24/2021

CLINICAL DATA: Dizziness, persistent/recurrent, cardiac or vascular
cause suspected.

EXAM:
MRI HEAD WITHOUT CONTRAST
TECHNIQUE: Multiplanar, multiecho pulse sequences of the brain and surrounding
structures were obtained without intravenous contrast.

[Series 9: DWI · axial · 3.0mm · 1.36mm/px · z∈[-7,+129]mm · 6 of 96 slices shown (1 of 2)]
[im 1/96]
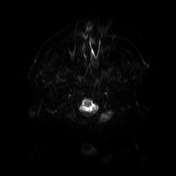
[im 20/96]
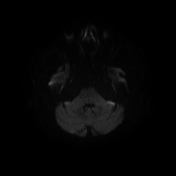
[im 39/96]
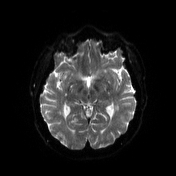
[im 58/96]
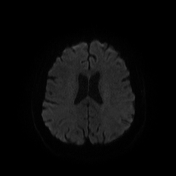
[im 77/96]
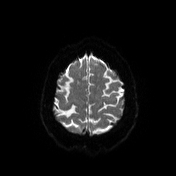
[im 96/96]
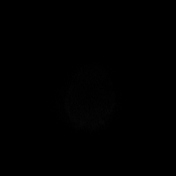

[Series 10: DWI · axial · 3.0mm · 1.36mm/px · z∈[-7,+129]mm · 4 of 48 slices shown (2 of 2)]
[im 1/48]
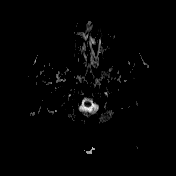
[im 16/48]
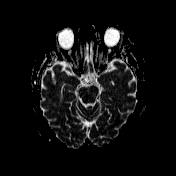
[im 32/48]
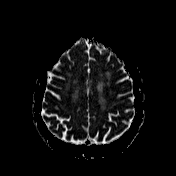
[im 48/48]
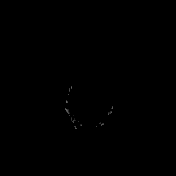

[Series 11: T1 · sagittal · 5.0mm · 0.75mm/px · 2 of 26 slices shown (1 of 2)]
[im 1/26]
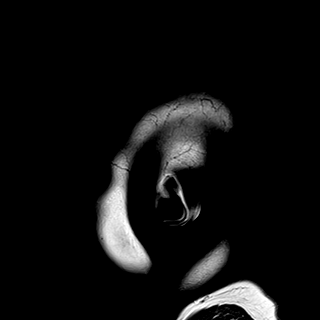
[im 26/26]
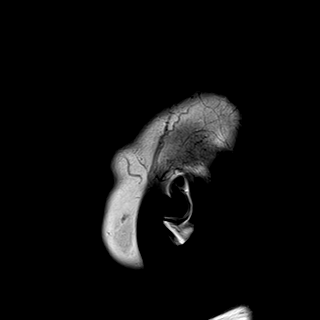

[Series 12: T2 · axial · 5.0mm · 0.62mm/px · z∈[-22,+135]mm · 2 of 26 slices shown (1 of 2)]
[im 1/26]
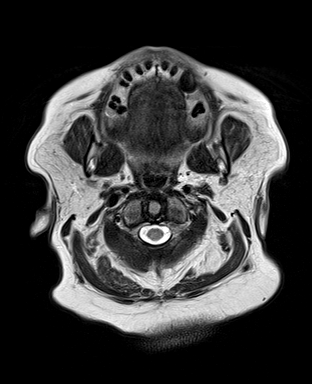
[im 26/26]
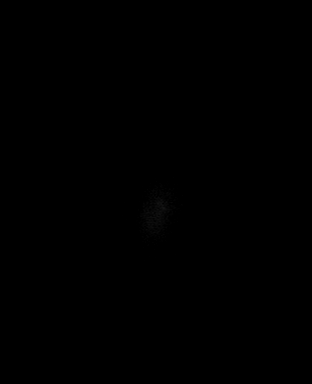

[Series 13: swi_images · axial · 3.0mm · 0.75mm/px · z∈[-46,+160]mm · 5 of 72 slices shown]
[im 1/72]
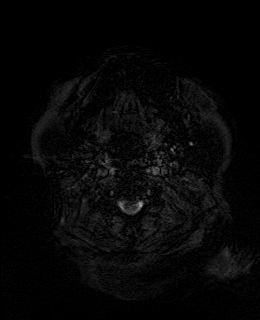
[im 18/72]
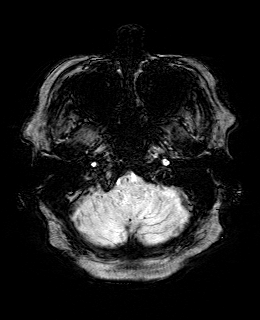
[im 36/72]
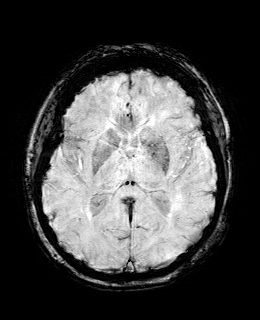
[im 54/72]
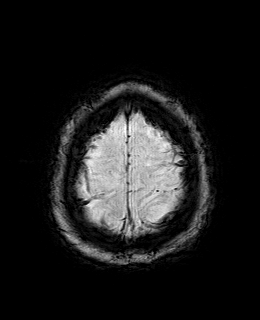
[im 72/72]
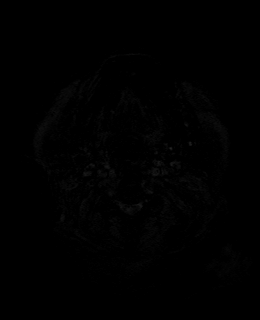

[Series 14: mip_images(sw) · axial · 24.0mm · 0.75mm/px · z∈[-36,+149]mm · 5 of 65 slices shown]
[im 1/65]
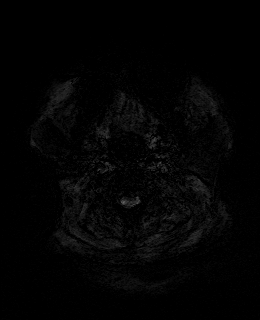
[im 17/65]
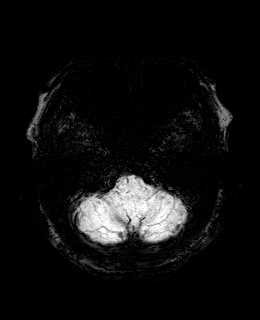
[im 33/65]
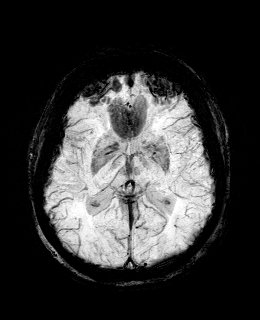
[im 49/65]
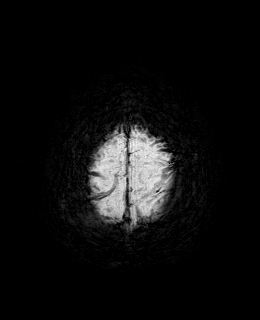
[im 65/65]
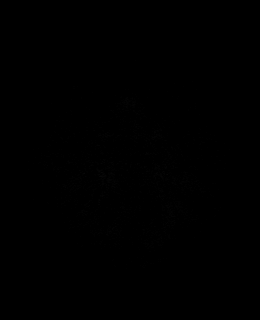

[Series 15: FLAIR · axial · 3.0mm · 0.75mm/px · z∈[-17,+131]mm · 4 of 52 slices shown]
[im 1/52]
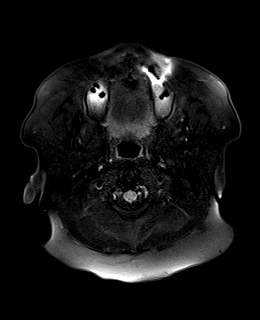
[im 18/52]
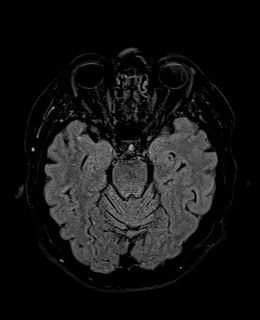
[im 35/52]
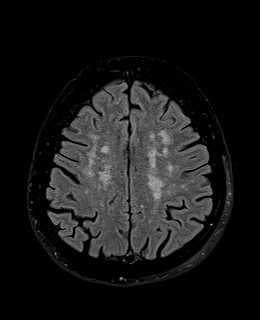
[im 52/52]
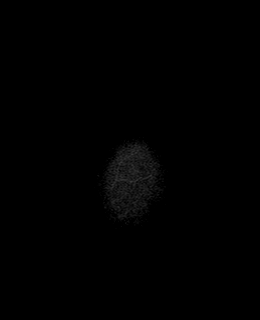

[Series 16: T1 · axial · 1.0mm · 0.94mm/px · z∈[-21,+133]mm · 12 of 160 slices shown (2 of 2)]
[im 1/160]
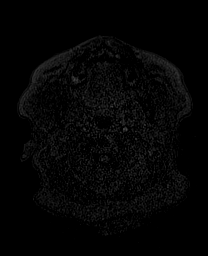
[im 15/160]
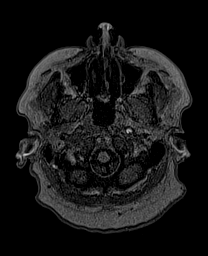
[im 29/160]
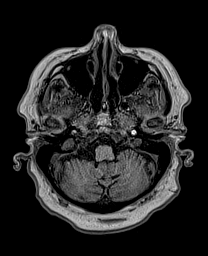
[im 44/160]
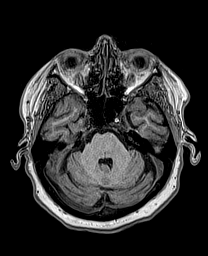
[im 58/160]
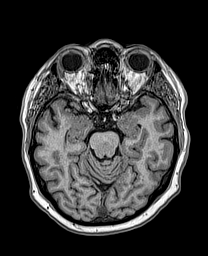
[im 73/160]
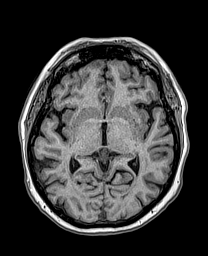
[im 87/160]
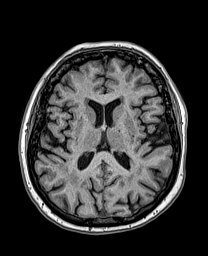
[im 102/160]
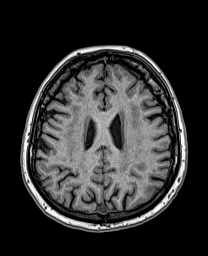
[im 116/160]
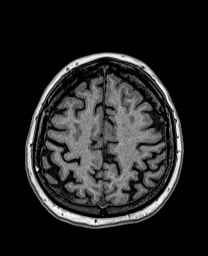
[im 131/160]
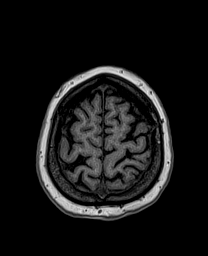
[im 145/160]
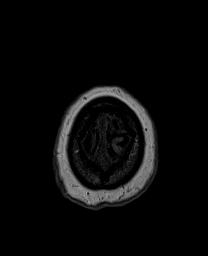
[im 160/160]
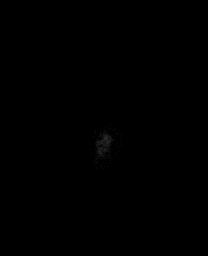

[Series 17: cor dwi_tracew · coronal · 5.0mm · 1.53mm/px · 4 of 52 slices shown]
[im 1/52]
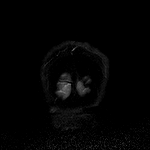
[im 18/52]
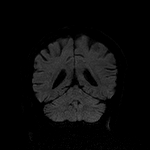
[im 35/52]
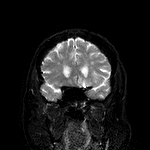
[im 52/52]
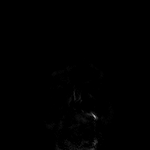

[Series 18: cor dwi_adc · coronal · 5.0mm · 1.53mm/px · 2 of 24 slices shown]
[im 1/24]
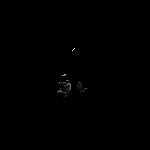
[im 24/24]
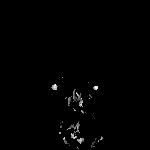

[Series 19: T2 · coronal · 5.0mm · 0.57mm/px · 2 of 34 slices shown (2 of 2)]
[im 1/34]
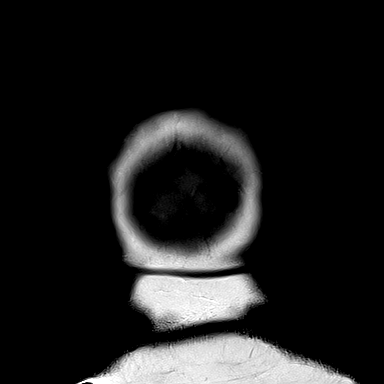
[im 34/34]
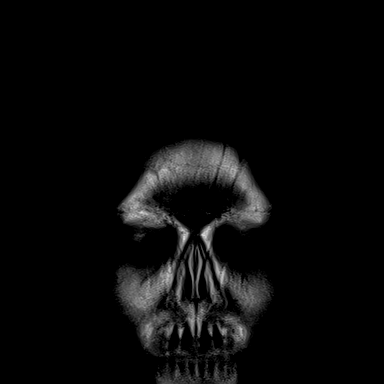

[48 of 48 positions shown; findings below may reference images not displayed]

FINDINGS: Brain: There is no evidence of an acute infarct, intracranial
hemorrhage, mass, midline shift, or extra-axial fluid collection.
Patchy T2 hyperintensities in the cerebral white matter nonspecific
but compatible with moderate chronic small vessel ischemic disease.
There is mild generalized cerebral atrophy. The posterior pituitary
bright spot is prominent with normal overall size of the pituitary
gland.

Vascular: Major intracranial vascular flow voids are preserved.

Skull and upper cervical spine: Unremarkable bone marrow signal para

Sinuses/Orbits: Bilateral cataract extraction. Paranasal sinuses and
mastoid air cells are clear.

Other: None.
IMPRESSION: 1. No acute intracranial abnormality.
2. Moderate chronic small vessel ischemic disease.
3.  Cerebral Atrophy ([id number]).

## 2023-08-29 ENCOUNTER — Emergency Department (HOSPITAL_BASED_OUTPATIENT_CLINIC_OR_DEPARTMENT_OTHER): Payer: PPO | Admitting: Radiology

## 2023-08-29 ENCOUNTER — Other Ambulatory Visit: Payer: Self-pay

## 2023-08-29 ENCOUNTER — Emergency Department (HOSPITAL_BASED_OUTPATIENT_CLINIC_OR_DEPARTMENT_OTHER)
Admission: EM | Admit: 2023-08-29 | Discharge: 2023-08-29 | Disposition: A | Discharge status: Home / Self Care | Payer: PPO | Attending: Emergency Medicine | Admitting: Emergency Medicine

## 2023-08-29 ENCOUNTER — Encounter (HOSPITAL_BASED_OUTPATIENT_CLINIC_OR_DEPARTMENT_OTHER): Payer: Self-pay | Admitting: Emergency Medicine

## 2023-08-29 DIAGNOSIS — M1611 Unilateral primary osteoarthritis, right hip: Secondary | ICD-10-CM | POA: Diagnosis not present

## 2023-08-29 DIAGNOSIS — Z794 Long term (current) use of insulin: Secondary | ICD-10-CM | POA: Diagnosis not present

## 2023-08-29 DIAGNOSIS — E119 Type 2 diabetes mellitus without complications: Secondary | ICD-10-CM | POA: Insufficient documentation

## 2023-08-29 DIAGNOSIS — M25551 Pain in right hip: Secondary | ICD-10-CM

## 2023-08-29 DIAGNOSIS — Z7984 Long term (current) use of oral hypoglycemic drugs: Secondary | ICD-10-CM | POA: Insufficient documentation

## 2023-08-29 MED ORDER — KETOROLAC TROMETHAMINE 15 MG/ML IJ SOLN
15.0000 mg | Freq: Once | INTRAMUSCULAR | Status: AC
  Administered 2023-08-29: 15 mg via INTRAMUSCULAR
  Filled 2023-08-29: qty 1

## 2023-08-29 NOTE — ED Triage Notes (Signed)
Pt c/o RT hip pain that has worsened over last 2 days. Difficulty ambulating

## 2023-08-29 NOTE — ED Notes (Signed)
Discharge instructions, follow up care, and pain management reviewed and explained, pt verbalized understanding with no further questions on d/c. Pt caox4, ambulatory at her baseline with cane on d/c.

## 2023-08-29 NOTE — Discharge Instructions (Signed)
Please read and follow all provided instructions.  Your diagnoses today include:  1. Right hip pain   2. Primary osteoarthritis of right hip     Tests performed today include: An x-ray of the affected area - does NOT show any broken bones, shows arthritis Vital signs. See below for your results today.   Medications recommended:  Tylenol: Consider taking 650 mg 3-4 times daily for the next couple of days  Take any prescribed medications only as directed.  Home care instructions:  Follow any educational materials contained in this packet Follow R.I.C.E. Protocol: R - rest your injury  I  - use ice on injury without applying directly to skin C - compress injury with bandage or splint E - elevate the injury as much as possible  Follow-up instructions: Please follow-up with your primary care provider or the provided orthopedic physician (bone specialist) if you continue to have significant pain in 1 week. In this case you may have a more severe injury that requires further care.   Return instructions:  Please return if your toes or feet are numb or tingling, appear gray or blue, or you have severe pain (also elevate the leg and loosen splint or wrap if you were given one) Please return to the Emergency Department if you experience worsening symptoms.  Please return if you have any other emergent concerns.  Additional Information:  Your vital signs today were: BP (!) 149/62   Pulse 96   Temp 97.7 F (36.5 C) (Oral)   Resp 18   SpO2 100%  If your blood pressure (BP) was elevated above 135/85 this visit, please have this repeated by your doctor within one month. --------------

## 2023-08-29 NOTE — ED Provider Notes (Signed)
[place] EMERGENCY DEPARTMENT AT [facility] Provider Note   CSN: [id number] Arrival date & time: 08/29/23  4098     History  Chief Complaint  Patient presents with   Hip Pain    Yesenia Meyers is a 73 y.o. female.  Patient with history of diabetes presents to the emergency department for evaluation of left hip pain.  Patient has known history of arthritis.  She denies no new falls or injuries.  She has had worsening pain on the outside of the hip over the past several days.  No fevers.  No weakness in the legs.  Pain is worse when she goes from a sitting to standing position, not quite as bad when she takes a step.  She has tried Tylenol without much improvement.  She does have a history of some lower back problems as well. Patient denies warning symptoms of back pain including: fecal incontinence, urinary retention or overflow incontinence, night sweats, waking from sleep with back pain, unexplained fevers or weight loss, h/o cancer, IVDU, recent trauma.          Home Medications Prior to Admission medications   Medication Sig Start Date End Date Taking? Authorizing Provider  acetaminophen (TYLENOL) 500 MG tablet Take 500 mg by mouth every 6 (six) hours as needed (Pt does not take unless severe pain.).     [provider]  amLODipine (NORVASC) 5 MG tablet Take 5 mg by mouth daily. 06/16/21   [provider]  atorvastatin (LIPITOR) 20 MG tablet Take 20 mg by mouth daily.    [provider]  Dulaglutide (TRULICITY [place]) Inject 0.5 Units into the skin once a week.    [provider]  Insulin Glargine (BASAGLAR KWIKPEN) 100 UNIT/ML Inject 12-15 Units into the skin 2 (two) times daily. 12 units in the AM and 15 units in the evening 06/27/21   Sheikh, Omair Latif, DO  losartan (COZAAR) 100 MG tablet Take 1 tablet (100 mg total) by mouth daily. 06/28/21   Marguerita Merles Latif, DO  menthol-cetylpyridinium (CEPACOL) 3 MG lozenge Take 1 lozenge (3  mg total) by mouth as needed for sore throat. Patient not taking: Reported on 05/17/2022 06/27/21   Marguerita Merles Latif, DO  metFORMIN (GLUCOPHAGE) 500 MG tablet Take 500 mg by mouth 2 (two) times daily with a meal.    [provider]  Multiple Vitamin (MULTI-VITAMIN DAILY PO) Take 1 tablet by mouth daily.    [provider]  Omega-3 Fatty Acids (FISH OIL) 1000 MG CAPS Take 1,000 mg by mouth daily.    [provider]  Sodium Sulfate-Mag Sulfate-KCl (SUTAB) [phone] MG TABS TAKE AS DIRECTED 05/17/22   Lynann Bologna, MD      Allergies    Celebrex [celecoxib], Feldene [piroxicam], and Voltaren [diclofenac sodium]    Review of Systems   Review of Systems  Physical Exam Updated Vital Signs BP (!) 149/62   Pulse 96   Temp 97.7 F (36.5 C) (Oral)   Resp 18   SpO2 100%  Physical Exam Vitals and nursing note reviewed.  Constitutional:      Appearance: She is well-developed.  HENT:     Head: Normocephalic and atraumatic.  Eyes:     Conjunctiva/sclera: Conjunctivae normal.  Cardiovascular:     Pulses:          Dorsalis pedis pulses are 2+ on the right side and 2+ on the left side.  Pulmonary:     Effort: Pulmonary effort is normal.  Abdominal:     Palpations: Abdomen is soft.     Tenderness: There is no abdominal tenderness.  Musculoskeletal:        General: Normal range of motion.     Cervical back: Normal range of motion and neck supple.     Right hip: Tenderness present. No bony tenderness. Normal range of motion.       Legs:     Comments: No step-off noted with palpation of spine.   Patient is able to flex and extend at the knee and hip with mild discomfort.  She has tenderness to palpation over the anterior hip and the iliac crest area.  Skin appears normal.  No abscess or cellulitis.  Skin:    General: Skin is warm and dry.     Findings: No rash.  Neurological:     Mental Status: She is alert.     Sensory: No sensory deficit.     Deep  Tendon Reflexes: Reflexes are normal and symmetric.     Comments: 5/5 strength in entire lower extremities bilaterally. No sensation deficit.      ED Results / Procedures / Treatments   Labs (all labs ordered are listed, but only abnormal results are displayed) Labs Reviewed - No data to display  EKG None  Radiology DG Hip Unilat W or Wo Pelvis 2-3 Views Right Result Date: 08/29/2023 CLINICAL DATA:  Right hip pain.  No known trauma. EXAM: DG HIP (WITH OR WITHOUT PELVIS) 2-3V RIGHT COMPARISON:  None Available. FINDINGS: Pelvis is intact with normal and symmetric sacroiliac joints. No acute fracture or dislocation. No aggressive osseous lesion. Visualized sacral arcuate lines are unremarkable. Unremarkable symphysis pubis. There are mild-to-moderate degenerative changes of bilateral hip joints without significant joint space narrowing. Osteophytosis of the superior acetabulum. No radiopaque foreign bodies. IMPRESSION: *No acute osseous abnormality of the pelvis or right hip. Electronically Signed   By: Jules Schick M.D.   On: 08/29/2023 10:47    Procedures Procedures    Medications Ordered in ED Medications  ketorolac (TORADOL) 15 MG/ML injection 15 mg (15 mg Intramuscular Given 08/29/23 1030)    ED Course/ Medical Decision Making/ A&P    Patient seen and examined. History obtained directly from patient.   Labs/EKG: None ordered  Imaging: Ordered x-ray of the hip and pelvis.  Medications/Fluids: Ordered: IM toradol.   Most recent vital signs reviewed and are as follows: BP (!) 149/62   Pulse 96   Temp 97.7 F (36.5 C) (Oral)   Resp 18   SpO2 100%   Initial impression: Right hip pain  11:24 AM Reassessment performed. Patient appears stable, comfortable.  States that the Toradol helped.  Imaging personally visualized and interpreted including: X-ray agree arthritis changes, no fracture  Reviewed pertinent lab work and imaging with patient at bedside. Questions  answered.   Most current vital signs reviewed and are as follows: BP (!) 149/62   Pulse 96   Temp 97.7 F (36.5 C) (Oral)   Resp 18   SpO2 100%   Plan: Discharge to home.   Prescriptions written for: None  Other home care instructions discussed: Patient has intolerance to multiple oral NSAIDs and will need to avoid these at home, I encouraged her to take 650 mg Tylenol every 6 hours for the next couple of days to help the head of her pain.  ED return instructions discussed: New or worsening symptoms, uncontrolled symptoms  Follow-up instructions discussed: Patient encouraged to follow-up with her orthopedist in 1  week.                                  Medical Decision Making Amount and/or Complexity of Data Reviewed Radiology: ordered.  Risk Prescription drug management.   Patient with right hip pain.  She has reasonable range of motion making septic arthritis less likely.  She does have osteoarthritis of the hip.  No fracture on x-ray.  Her pain today is mostly over the anterior hip, iliac crest area, no signs of skin infection, swelling, cellulitis or abscess.  I have a lower concern for referred pain from the lower back.  Patient looks well and feels better with treatment in the ED.  Will continue symptomatic treatment at home and follow-up as outpatient.        Final Clinical Impression(s) / ED Diagnoses Final diagnoses:  Right hip pain  Primary osteoarthritis of right hip    Rx / DC Orders ED Discharge Orders     None         Renne Crigler, PA-C 08/29/23 1126    Ernie Avena, MD 08/29/23 1155

## 2023-09-23 DIAGNOSIS — M25551 Pain in right hip: Secondary | ICD-10-CM | POA: Diagnosis not present

## 2023-09-25 NOTE — Progress Notes (Unsigned)
 New Patient Note  RE: Yesenia Meyers MRN: [id number] DOB: 18-Jun-1951 Date of Office Visit: 09/26/2023  Consult requested by: Jackelyn Poling, DO Primary care provider: Jackelyn Poling, DO  Chief Complaint: No chief complaint on file.  History of Present Illness: I had the pleasure of seeing Yesenia Meyers for initial evaluation at the [facility] on 09/25/2023. She is a 73 y.o. female, who is referred here by Jackelyn Poling, DO for the evaluation of ***.  Discussed the use of AI scribe software for clinical note transcription with the patient, who gave verbal consent to proceed.  History of Present Illness             ***  Assessment and Plan: Yesenia Meyers is a 73 y.o. female with: ***  Assessment and Plan               No follow-ups on file.  No orders of the defined types were placed in this encounter.  Lab Orders  No laboratory test(s) ordered today    Other allergy screening: Asthma: {Blank single:19197::"yes","no"} Rhino conjunctivitis: {Blank single:19197::"yes","no"} Food allergy: {Blank single:19197::"yes","no"} Medication allergy: {Blank single:19197::"yes","no"} Hymenoptera allergy: {Blank single:19197::"yes","no"} Urticaria: {Blank single:19197::"yes","no"} Eczema:{Blank single:19197::"yes","no"} History of recurrent infections suggestive of immunodeficency: {Blank single:19197::"yes","no"}  Diagnostics: Spirometry:  Tracings reviewed. Her effort: {Blank single:19197::"Good reproducible efforts.","It was hard to get consistent efforts and there is a question as to whether this reflects a maximal maneuver.","Poor effort, data can not be interpreted."} FVC: ***L FEV1: ***L, ***% predicted FEV1/FVC ratio: ***% Interpretation: {Blank single:19197::"Spirometry consistent with mild obstructive disease","Spirometry consistent with moderate obstructive disease","Spirometry consistent with severe obstructive disease","Spirometry consistent with  possible restrictive disease","Spirometry consistent with mixed obstructive and restrictive disease","Spirometry uninterpretable due to technique","Spirometry consistent with normal pattern","No overt abnormalities noted given today's efforts"}.  Please see scanned spirometry results for details.  Skin Testing: {Blank single:19197::"Select foods","Environmental allergy panel","Environmental allergy panel and select foods","Food allergy panel","None","Deferred due to recent antihistamines use"}. *** Results discussed with patient/family.   Past Medical History: Patient Active Problem List   Diagnosis Date Noted  . Generalized weakness 06/25/2021  . Type 2 diabetes mellitus (HCC) 06/25/2021  . Influenza A 06/25/2021  . Hyperlipemia   . Hypertension    Past Medical History:  Diagnosis Date  . Allergy    "SEASONAL LATELY"  . Arthritis    "HANDS,LOWER BACK,LEFT KNEE"  . Atypical chest pain    Related to anxiety   . Cataract    BILATERAL X2,REMOVED  . Diabetes mellitus without complication (HCC)   . DOE (dyspnea on exertion)   . Hyperlipemia   . Hypertension    Past Surgical History: Past Surgical History:  Procedure Laterality Date  . BACK SURGERY  1986   Crushed 2 discs   . BACK SURGERY  2004   Spinal stenosis  . CARPAL TUNNEL RELEASE Bilateral   . CATARACTS Bilateral    X2  . KNEE SURGERY Left    73 y.o.  . PARTIAL HYSTERECTOMY N/A    Age 33  . TUBAL LIGATION  1974   Medication List:  Current Outpatient Medications  Medication Sig Dispense Refill  . acetaminophen (TYLENOL) 500 MG tablet Take 500 mg by mouth every 6 (six) hours as needed (Pt does not take unless severe pain.).     Marland Kitchen amLODipine (NORVASC) 5 MG tablet Take 5 mg by mouth daily.    Marland Kitchen atorvastatin (LIPITOR) 20 MG tablet Take 20 mg by mouth daily.    . Dulaglutide (TRULICITY [place])  Inject 0.5 Units into the skin once a week.    . Insulin Glargine (BASAGLAR KWIKPEN) 100 UNIT/ML Inject 12-15 Units into the skin  2 (two) times daily. 12 units in the AM and 15 units in the evening    . losartan (COZAAR) 100 MG tablet Take 1 tablet (100 mg total) by mouth daily. 30 tablet 0  . menthol-cetylpyridinium (CEPACOL) 3 MG lozenge Take 1 lozenge (3 mg total) by mouth as needed for sore throat. (Patient not taking: Reported on 05/17/2022) 100 tablet 0  . metFORMIN (GLUCOPHAGE) 500 MG tablet Take 500 mg by mouth 2 (two) times daily with a meal.    . Multiple Vitamin (MULTI-VITAMIN DAILY PO) Take 1 tablet by mouth daily.    . Omega-3 Fatty Acids (FISH OIL) 1000 MG CAPS Take 1,000 mg by mouth daily.    . Sodium Sulfate-Mag Sulfate-KCl (SUTAB) [phone] MG TABS TAKE AS DIRECTED 24 tablet 0   No current facility-administered medications for this visit.   Allergies: Allergies  Allergen Reactions  . Celebrex [Celecoxib] Rash  . Feldene [Piroxicam] Swelling  . Voltaren [Diclofenac Sodium] Rash   Social History: Social History   Socioeconomic History  . Marital status: Divorced    Spouse name: Not on file  . Number of children: Not on file  . Years of education: Not on file  . Highest education level: Not on file  Occupational History  . Not on file  Tobacco Use  . Smoking status: Never    Passive exposure: Past  . Smokeless tobacco: Never  Vaping Use  . Vaping status: Never Used  Substance and Sexual Activity  . Alcohol use: No  . Drug use: No  . Sexual activity: Yes  Other Topics Concern  . Not on file  Social History Narrative  . Not on file   Social Drivers of Health   Financial Resource Strain: Not on file  Food Insecurity: Not on file  Transportation Needs: Not on file  Physical Activity: Not on file  Stress: Not on file  Social Connections: Not on file   Lives in a ***. Smoking: *** Occupation: ***  Environmental HistorySurveyor, minerals in the house: Copywriter, advertising in the family room: {Blank single:19197::"yes","no"} Carpet in the bedroom: {Blank  single:19197::"yes","no"} Heating: {Blank single:19197::"electric","gas","heat pump"} Cooling: {Blank single:19197::"central","window","heat pump"} Pet: {Blank single:19197::"yes ***","no"}  Family History: Family History  Problem Relation Age of Onset  . Heart disease Mother        73's  . Diabetes Mother   . Hypertension Mother   . Heart disease Father        35's  . Diabetes Father   . Hypertension Father   . Colon polyps Sister   . Diabetes Sister   . Diabetes Brother   . Cancer Brother        Colon  . Colon cancer Brother   . Diabetes Brother   . Crohn's disease Neg Hx   . Esophageal cancer Neg Hx   . Rectal cancer Neg Hx   . Stomach cancer Neg Hx   . Ulcerative colitis Neg Hx    Problem                               Relation Asthma                                   ***  Eczema                                *** Food allergy                          *** Allergic rhino conjunctivitis     ***  Review of Systems  Constitutional:  Negative for appetite change, chills, fever and unexpected weight change.  HENT:  Negative for congestion and rhinorrhea.   Eyes:  Negative for itching.  Respiratory:  Negative for cough, chest tightness, shortness of breath and wheezing.   Cardiovascular:  Negative for chest pain.  Gastrointestinal:  Negative for abdominal pain.  Genitourinary:  Negative for difficulty urinating.  Skin:  Negative for rash.  Neurological:  Negative for headaches.   Objective: There were no vitals taken for this visit. There is no height or weight on file to calculate BMI. Physical Exam Vitals and nursing note reviewed.  Constitutional:      Appearance: Normal appearance. She is well-developed.  HENT:     Head: Normocephalic and atraumatic.     Right Ear: Tympanic membrane and external ear normal.     Left Ear: Tympanic membrane and external ear normal.     Nose: Nose normal.     Mouth/Throat:     Mouth: Mucous membranes are moist.     Pharynx:  Oropharynx is clear.  Eyes:     Conjunctiva/sclera: Conjunctivae normal.  Cardiovascular:     Rate and Rhythm: Normal rate and regular rhythm.     Heart sounds: Normal heart sounds. No murmur heard.    No friction rub. No gallop.  Pulmonary:     Effort: Pulmonary effort is normal.     Breath sounds: Normal breath sounds. No wheezing, rhonchi or rales.  Musculoskeletal:     Cervical back: Neck supple.  Skin:    General: Skin is warm.     Findings: No rash.  Neurological:     Mental Status: She is alert and oriented to person, place, and time.  Psychiatric:        Behavior: Behavior normal.  The plan was reviewed with the patient/family, and all questions/concerned were addressed.  It was my pleasure to see Yesenia Meyers today and participate in her care. Please feel free to contact me with any questions or concerns.  Sincerely,  Wyline Mood, DO Allergy & Immunology  [facility] office: [phone] [facility] office: [phone]

## 2023-09-26 ENCOUNTER — Telehealth: Payer: Self-pay | Admitting: Allergy

## 2023-09-26 ENCOUNTER — Ambulatory Visit: Admitting: Allergy

## 2023-09-26 ENCOUNTER — Other Ambulatory Visit: Payer: Self-pay

## 2023-09-26 ENCOUNTER — Encounter: Payer: Self-pay | Admitting: Allergy

## 2023-09-26 VITALS — BP 110/76 | HR 76 | Temp 98.1°F | Resp 18 | Ht 59.5 in | Wt 161.9 lb

## 2023-09-26 DIAGNOSIS — K219 Gastro-esophageal reflux disease without esophagitis: Secondary | ICD-10-CM | POA: Diagnosis not present

## 2023-09-26 DIAGNOSIS — R490 Dysphonia: Secondary | ICD-10-CM | POA: Diagnosis not present

## 2023-09-26 DIAGNOSIS — R1310 Dysphagia, unspecified: Secondary | ICD-10-CM | POA: Diagnosis not present

## 2023-09-26 MED ORDER — OMEPRAZOLE 40 MG PO CPDR: 40.0000 mg | DELAYED_RELEASE_CAPSULE | Freq: Every day | ORAL | 2 refills | Status: DC

## 2023-09-26 NOTE — Telephone Encounter (Signed)
 Please place referral to ENT - Dr. Rubye Oaks for voice change.

## 2023-09-26 NOTE — Patient Instructions (Addendum)
 Voice changes Refer to Dr. Rubye Oaks who is a vocal cord specialist.  Phone number: [phone] I don't think this is from allergies.  Possible reflux See handout for lifestyle and dietary modifications. Start omeprazole 40mg  once day - nothing to eat or drink for 20-30 minutes afterwards.  Try this for 1 month.   Follow up in 3 months or sooner if needed.  Send me the results of your endoscopy results.

## 2023-09-30 ENCOUNTER — Other Ambulatory Visit: Payer: Self-pay

## 2023-09-30 DIAGNOSIS — D472 Monoclonal gammopathy: Secondary | ICD-10-CM

## 2023-10-03 ENCOUNTER — Inpatient Hospital Stay: Payer: PPO | Admitting: Hematology

## 2023-10-03 ENCOUNTER — Inpatient Hospital Stay: Payer: PPO

## 2023-10-27 NOTE — Telephone Encounter (Signed)
 Aleighya has been referred to:  [facility] [address] [place], [place] [address]  [phone]   Dr. Miki Alert is no longer taking new patients.  I did send Treniya to the office Dr. Miki Alert is at.  I have faxed the referral and all corresponding notes to their office.  They will reach out to the patient to schedule.

## 2023-11-30 NOTE — Telephone Encounter (Signed)
 Apparently I was lied to.  Dr. Miki Alert is taking new patients.  Emslee is scheduled with [facility] for 12/22/23 with Dr. Miki Alert.  They did not give me the time.

## 2023-12-02 DIAGNOSIS — R490 Dysphonia: Secondary | ICD-10-CM | POA: Diagnosis not present

## 2023-12-02 DIAGNOSIS — E7800 Pure hypercholesterolemia, unspecified: Secondary | ICD-10-CM | POA: Diagnosis not present

## 2023-12-02 DIAGNOSIS — D472 Monoclonal gammopathy: Secondary | ICD-10-CM | POA: Diagnosis not present

## 2023-12-02 DIAGNOSIS — Z794 Long term (current) use of insulin: Secondary | ICD-10-CM | POA: Diagnosis not present

## 2023-12-02 DIAGNOSIS — E1122 Type 2 diabetes mellitus with diabetic chronic kidney disease: Secondary | ICD-10-CM | POA: Diagnosis not present

## 2023-12-02 DIAGNOSIS — N1831 Chronic kidney disease, stage 3a: Secondary | ICD-10-CM | POA: Diagnosis not present

## 2023-12-19 DIAGNOSIS — R490 Dysphonia: Secondary | ICD-10-CM | POA: Insufficient documentation

## 2023-12-22 DIAGNOSIS — R0989 Other specified symptoms and signs involving the circulatory and respiratory systems: Secondary | ICD-10-CM | POA: Diagnosis not present

## 2023-12-22 DIAGNOSIS — J385 Laryngeal spasm: Secondary | ICD-10-CM | POA: Diagnosis not present

## 2023-12-22 DIAGNOSIS — J383 Other diseases of vocal cords: Secondary | ICD-10-CM | POA: Diagnosis not present

## 2023-12-22 DIAGNOSIS — R490 Dysphonia: Secondary | ICD-10-CM | POA: Diagnosis not present

## 2023-12-28 DIAGNOSIS — E119 Type 2 diabetes mellitus without complications: Secondary | ICD-10-CM | POA: Diagnosis not present

## 2023-12-28 DIAGNOSIS — H53143 Visual discomfort, bilateral: Secondary | ICD-10-CM | POA: Diagnosis not present

## 2024-02-12 ENCOUNTER — Emergency Department (HOSPITAL_BASED_OUTPATIENT_CLINIC_OR_DEPARTMENT_OTHER)

## 2024-02-12 ENCOUNTER — Emergency Department (HOSPITAL_COMMUNITY)
Admission: EM | Admit: 2024-02-12 | Discharge: 2024-02-13 | Disposition: A | Discharge status: Home / Self Care | Source: Home / Self Care | Attending: Emergency Medicine | Admitting: Emergency Medicine

## 2024-02-12 ENCOUNTER — Other Ambulatory Visit: Payer: Self-pay

## 2024-02-12 ENCOUNTER — Emergency Department (HOSPITAL_COMMUNITY)

## 2024-02-12 ENCOUNTER — Emergency Department (HOSPITAL_BASED_OUTPATIENT_CLINIC_OR_DEPARTMENT_OTHER)
Admission: EM | Admit: 2024-02-12 | Discharge: 2024-02-12 | Disposition: A | Discharge status: Home / Self Care | Attending: Emergency Medicine | Admitting: Emergency Medicine

## 2024-02-12 DIAGNOSIS — W1839XA Other fall on same level, initial encounter: Secondary | ICD-10-CM | POA: Diagnosis not present

## 2024-02-12 DIAGNOSIS — Z79899 Other long term (current) drug therapy: Secondary | ICD-10-CM | POA: Insufficient documentation

## 2024-02-12 DIAGNOSIS — S199XXA Unspecified injury of neck, initial encounter: Secondary | ICD-10-CM | POA: Diagnosis not present

## 2024-02-12 DIAGNOSIS — I6523 Occlusion and stenosis of bilateral carotid arteries: Secondary | ICD-10-CM | POA: Diagnosis not present

## 2024-02-12 DIAGNOSIS — Z8616 Personal history of COVID-19: Secondary | ICD-10-CM | POA: Insufficient documentation

## 2024-02-12 DIAGNOSIS — R531 Weakness: Secondary | ICD-10-CM | POA: Insufficient documentation

## 2024-02-12 DIAGNOSIS — U071 COVID-19: Secondary | ICD-10-CM | POA: Insufficient documentation

## 2024-02-12 DIAGNOSIS — I700 Atherosclerosis of aorta: Secondary | ICD-10-CM | POA: Diagnosis not present

## 2024-02-12 DIAGNOSIS — Z043 Encounter for examination and observation following other accident: Secondary | ICD-10-CM | POA: Diagnosis not present

## 2024-02-12 DIAGNOSIS — Z794 Long term (current) use of insulin: Secondary | ICD-10-CM | POA: Diagnosis not present

## 2024-02-12 DIAGNOSIS — R509 Fever, unspecified: Secondary | ICD-10-CM | POA: Diagnosis present

## 2024-02-12 DIAGNOSIS — M25512 Pain in left shoulder: Secondary | ICD-10-CM | POA: Insufficient documentation

## 2024-02-12 DIAGNOSIS — M546 Pain in thoracic spine: Secondary | ICD-10-CM | POA: Insufficient documentation

## 2024-02-12 DIAGNOSIS — W19XXXA Unspecified fall, initial encounter: Secondary | ICD-10-CM | POA: Insufficient documentation

## 2024-02-12 DIAGNOSIS — I10 Essential (primary) hypertension: Secondary | ICD-10-CM | POA: Diagnosis not present

## 2024-02-12 DIAGNOSIS — E119 Type 2 diabetes mellitus without complications: Secondary | ICD-10-CM | POA: Insufficient documentation

## 2024-02-12 DIAGNOSIS — S0990XA Unspecified injury of head, initial encounter: Secondary | ICD-10-CM | POA: Diagnosis not present

## 2024-02-12 DIAGNOSIS — M19012 Primary osteoarthritis, left shoulder: Secondary | ICD-10-CM | POA: Diagnosis not present

## 2024-02-12 DIAGNOSIS — M542 Cervicalgia: Secondary | ICD-10-CM | POA: Diagnosis not present

## 2024-02-12 LAB — RESP PANEL BY RT-PCR (RSV, FLU A&B, COVID)  RVPGX2
Influenza A by PCR: NEGATIVE
Influenza B by PCR: NEGATIVE
Resp Syncytial Virus by PCR: NEGATIVE
SARS Coronavirus 2 by RT PCR: POSITIVE — AB

## 2024-02-12 LAB — COMPREHENSIVE METABOLIC PANEL WITH GFR
ALT: 20 U/L (ref 0–44)
AST: 23 U/L (ref 15–41)
Albumin: 3.7 g/dL (ref 3.5–5.0)
Alkaline Phosphatase: 62 U/L (ref 38–126)
Anion gap: 13 (ref 5–15)
BUN: 18 mg/dL (ref 8–23)
CO2: 23 mmol/L (ref 22–32)
Calcium: 8.7 mg/dL — ABNORMAL LOW (ref 8.9–10.3)
Chloride: 103 mmol/L (ref 98–111)
Creatinine, Ser: 1.31 mg/dL — ABNORMAL HIGH (ref 0.44–1.00)
GFR, Estimated: 43 mL/min — ABNORMAL LOW (ref >60)
Glucose, Bld: 93 mg/dL (ref 70–99)
Potassium: 3.8 mmol/L (ref 3.5–5.1)
Sodium: 139 mmol/L (ref 135–145)
Total Bilirubin: 0.5 mg/dL (ref 0.0–1.2)
Total Protein: 6.4 g/dL — ABNORMAL LOW (ref 6.5–8.1)

## 2024-02-12 LAB — CBC WITH DIFFERENTIAL/PLATELET
Abs Immature Granulocytes: 0.08 K/uL — ABNORMAL HIGH (ref 0.00–0.07)
Basophils Absolute: 0.1 K/uL (ref 0.0–0.1)
Basophils Relative: 1 %
Eosinophils Absolute: 0.2 K/uL (ref 0.0–0.5)
Eosinophils Relative: 1 %
HCT: 39.2 % (ref 36.0–46.0)
Hemoglobin: 13.1 g/dL (ref 12.0–15.0)
Immature Granulocytes: 1 %
Lymphocytes Relative: 11 %
Lymphs Abs: 1.4 K/uL (ref 0.7–4.0)
MCH: 29.9 pg (ref 26.0–34.0)
MCHC: 33.4 g/dL (ref 30.0–36.0)
MCV: 89.5 fL (ref 80.0–100.0)
Monocytes Absolute: 1.8 K/uL — ABNORMAL HIGH (ref 0.1–1.0)
Monocytes Relative: 15 %
Neutro Abs: 9.1 K/uL — ABNORMAL HIGH (ref 1.7–7.7)
Neutrophils Relative %: 71 %
Platelets: 228 K/uL (ref 150–400)
RBC: 4.38 MIL/uL (ref 3.87–5.11)
RDW: 13 % (ref 11.5–15.5)
WBC: 12.7 K/uL — ABNORMAL HIGH (ref 4.0–10.5)
nRBC: 0 % (ref 0.0–0.2)

## 2024-02-12 LAB — GROUP A STREP BY PCR: Group A Strep by PCR: NOT DETECTED

## 2024-02-12 MED ORDER — METFORMIN HCL 500 MG PO TABS
500.0000 mg | ORAL_TABLET | Freq: Two times a day (BID) | ORAL | Status: DC
  Administered 2024-02-13: 500 mg via ORAL
  Filled 2024-02-12: qty 1

## 2024-02-12 MED ORDER — ATORVASTATIN CALCIUM 10 MG PO TABS
20.0000 mg | ORAL_TABLET | Freq: Every day | ORAL | Status: DC
  Administered 2024-02-13: 20 mg via ORAL
  Filled 2024-02-12: qty 2

## 2024-02-12 MED ORDER — ACETAMINOPHEN 500 MG PO TABS: 500.0000 mg | ORAL_TABLET | Freq: Four times a day (QID) | ORAL | Status: DC | PRN

## 2024-02-12 MED ORDER — ACETAMINOPHEN 500 MG PO TABS
1000.0000 mg | ORAL_TABLET | Freq: Once | ORAL | Status: AC
  Administered 2024-02-12: 1000 mg via ORAL
  Filled 2024-02-12: qty 2

## 2024-02-12 MED ORDER — FENTANYL CITRATE PF 50 MCG/ML IJ SOSY: 25.0000 ug | PREFILLED_SYRINGE | INTRAMUSCULAR | Status: DC | PRN

## 2024-02-12 MED ORDER — LOSARTAN POTASSIUM 50 MG PO TABS
100.0000 mg | ORAL_TABLET | Freq: Every day | ORAL | Status: DC
  Administered 2024-02-13: 100 mg via ORAL
  Filled 2024-02-12: qty 2

## 2024-02-12 MED ORDER — BASAGLAR KWIKPEN 100 UNIT/ML ~~LOC~~ SOPN: 12.0000 [IU] | PEN_INJECTOR | Freq: Two times a day (BID) | SUBCUTANEOUS | Status: DC

## 2024-02-12 MED ORDER — AMLODIPINE BESYLATE 5 MG PO TABS
5.0000 mg | ORAL_TABLET | Freq: Every day | ORAL | Status: DC
  Administered 2024-02-13: 5 mg via ORAL
  Filled 2024-02-12: qty 1

## 2024-02-12 MED ORDER — SODIUM CHLORIDE 0.9 % IV BOLUS
1000.0000 mL | Freq: Once | INTRAVENOUS | Status: AC
  Administered 2024-02-12: 1000 mL via INTRAVENOUS

## 2024-02-12 MED ORDER — FAMOTIDINE 20 MG PO TABS: 20.0000 mg | ORAL_TABLET | Freq: Every evening | ORAL | Status: DC | PRN

## 2024-02-12 MED ORDER — FENTANYL CITRATE PF 50 MCG/ML IJ SOSY
12.5000 ug | PREFILLED_SYRINGE | Freq: Once | INTRAMUSCULAR | Status: AC
  Administered 2024-02-12: 12.5 ug via INTRAVENOUS
  Filled 2024-02-12: qty 1

## 2024-02-12 NOTE — ED Notes (Signed)
 Pt unable to void at this time.

## 2024-02-12 NOTE — ED Triage Notes (Signed)
 Pt BIB [facility]. Pt had a unwitnessed fall. Pt c/o shoulder pain. No LOC, No thinners, denies hitting head  97.9T 17RR 138/67

## 2024-02-12 NOTE — ED Provider Notes (Signed)
 [place] EMERGENCY DEPARTMENT AT [facility] Provider Note   CSN: [id number] Arrival date & time: 02/12/24  2013     Patient presents with: Yesenia   DOSSIE Meyers is a 73 y.o. female.   HPI Patient presents with concern of pain in the left shoulder, paraspinal left side following a fall.  Patient arrives from an assisted living facility.  She notes a mechanical fall, stumbling, falling on her shoulder.  No loss of consciousness, she denies any weakness in her extremities, any hip pain. EMS reports no hemodynamic instability in transport. Patient's history is notable for diagnosis of COVID earlier today at a different emergency department.    Prior to Admission medications   Medication Sig Start Date End Date Taking? Authorizing Provider  famotidine  (PEPCID ) 20 MG tablet Take 20 mg by mouth at bedtime as needed. 12/19/23  Yes [provider]  acetaminophen  (TYLENOL ) 500 MG tablet Take 500 mg by mouth every 6 (six) hours as needed (Pt does not take unless severe pain.).     [provider]  amLODipine  (NORVASC ) 5 MG tablet Take 5 mg by mouth daily. 06/16/21   [provider]  atorvastatin  (LIPITOR) 20 MG tablet Take 20 mg by mouth daily.    [provider]  Dulaglutide (TRULICITY [place]) Inject 0.5 Units into the skin once a week.    [provider]  Insulin  Glargine (BASAGLAR  KWIKPEN) 100 UNIT/ML Inject 12-15 Units into the skin 2 (two) times daily. 12 units in the AM and 15 units in the evening 06/27/21   Sheikh, Omair Latif, DO  losartan  (COZAAR ) 100 MG tablet Take 1 tablet (100 mg total) by mouth daily. 06/28/21   Sherrill Cable Latif, DO  metFORMIN  (GLUCOPHAGE ) 500 MG tablet Take 500 mg by mouth 2 (two) times daily with a meal.    [provider]  Multiple Vitamin (MULTI-VITAMIN DAILY PO) Take 1 tablet by mouth daily.    [provider]  Omega-3 Fatty Acids (FISH OIL) 1000 MG CAPS Take 1,000 mg by mouth daily.     [provider]  omeprazole  (PRILOSEC) 40 MG capsule Take 1 capsule (40 mg total) by mouth daily. 09/26/23   Luke Orlan HERO, DO  Sodium Sulfate-Mag Sulfate-KCl (SUTAB ) 1479-225-188 MG TABS TAKE AS DIRECTED 05/17/22   Charlanne Groom, MD    Allergies: Celebrex [celecoxib], Feldene [piroxicam], and Voltaren [diclofenac sodium]    Review of Systems  Updated Vital Signs BP (!) 173/77   Pulse (!) 101   Temp 98.2 F (36.8 C)   Resp 18   SpO2 97%   Physical Exam Vitals and nursing note reviewed.  Constitutional:      General: She is not in acute distress.    Appearance: She is well-developed.  HENT:     Head: Normocephalic and atraumatic.  Eyes:     Conjunctiva/sclera: Conjunctivae normal.  Cardiovascular:     Rate and Rhythm: Normal rate and regular rhythm.  Pulmonary:     Effort: Pulmonary effort is normal. No respiratory distress.     Breath sounds: Normal breath sounds. No stridor.  Abdominal:     General: There is no distension.  Musculoskeletal:       Arms:  Skin:    General: Skin is warm and dry.  Neurological:     Mental Status: She is alert and oriented to person, place, and time.     Cranial Nerves: No cranial nerve deficit.     Motor: Atrophy present. No tremor  or abnormal muscle tone.  Psychiatric:        Mood and Affect: Mood normal.     (all labs ordered are listed, but only abnormal results are displayed) Labs Reviewed - No data to display  EKG: None  Radiology: CT Cervical Spine Wo Contrast Result Date: 02/12/2024 CLINICAL DATA:  Trauma EXAM: CT HEAD WITHOUT CONTRAST CT CERVICAL SPINE WITHOUT CONTRAST TECHNIQUE: Multidetector CT imaging of the head and cervical spine was performed following the standard protocol without intravenous contrast. Multiplanar CT image reconstructions of the cervical spine were also generated. RADIATION DOSE REDUCTION: This exam was performed according to the departmental dose-optimization program which includes automated  exposure control, adjustment of the mA and/or kV according to patient size and/or use of iterative reconstruction technique. COMPARISON:  CT brain and cervical spine 02/12/2024 FINDINGS: CT HEAD FINDINGS Brain: No acute territorial infarction, hemorrhage or intracranial mass. Moderate white matter hypodensity consistent with chronic small vessel disease. Stable ventricle size. Vascular: No hyperdense vessels.  Carotid vascular calcification Skull: Normal. Negative for fracture or focal lesion. Sinuses/Orbits: Mild mucosal thickening in the sinuses Other: None CT CERVICAL SPINE FINDINGS Alignment: Straightening of the cervical spine. No subluxation. Facet alignment is within normal limits. Skull base and vertebrae: No acute fracture. No primary bone lesion or focal pathologic process. Soft tissues and spinal canal: No prevertebral fluid or swelling. No visible canal hematoma. Disc levels: Bulky anterior osteophytes C2 through C4. Advanced disc space narrowing C4 through T1. Bulky posterior disc osteophyte complex C4-C5, C5-C6, diffuse moderate severe canal stenosis C4 through C7. Multilevel bilateral foraminal narrowing. Upper chest: Negative. Other: None IMPRESSION: 1. No CT evidence for acute intracranial abnormality. Chronic small vessel disease of the white matter. 2. Straightening of the cervical spine with advanced degenerative changes. No acute osseous abnormality. Moderate severe diffuse canal stenosis C4 through C7, worst at C5-C6. As previously stated, suggest MRI cervical spine to better assess the canal and cord Electronically Signed   By: Luke Bun M.D.   On: 02/12/2024 22:37   CT Head Wo Contrast Result Date: 02/12/2024 CLINICAL DATA:  Trauma EXAM: CT HEAD WITHOUT CONTRAST CT CERVICAL SPINE WITHOUT CONTRAST TECHNIQUE: Multidetector CT imaging of the head and cervical spine was performed following the standard protocol without intravenous contrast. Multiplanar CT image reconstructions of the  cervical spine were also generated. RADIATION DOSE REDUCTION: This exam was performed according to the departmental dose-optimization program which includes automated exposure control, adjustment of the mA and/or kV according to patient size and/or use of iterative reconstruction technique. COMPARISON:  CT brain and cervical spine 02/12/2024 FINDINGS: CT HEAD FINDINGS Brain: No acute territorial infarction, hemorrhage or intracranial mass. Moderate white matter hypodensity consistent with chronic small vessel disease. Stable ventricle size. Vascular: No hyperdense vessels.  Carotid vascular calcification Skull: Normal. Negative for fracture or focal lesion. Sinuses/Orbits: Mild mucosal thickening in the sinuses Other: None CT CERVICAL SPINE FINDINGS Alignment: Straightening of the cervical spine. No subluxation. Facet alignment is within normal limits. Skull base and vertebrae: No acute fracture. No primary bone lesion or focal pathologic process. Soft tissues and spinal canal: No prevertebral fluid or swelling. No visible canal hematoma. Disc levels: Bulky anterior osteophytes C2 through C4. Advanced disc space narrowing C4 through T1. Bulky posterior disc osteophyte complex C4-C5, C5-C6, diffuse moderate severe canal stenosis C4 through C7. Multilevel bilateral foraminal narrowing. Upper chest: Negative. Other: None IMPRESSION: 1. No CT evidence for acute intracranial abnormality. Chronic small vessel disease of the white matter. 2. Straightening of  the cervical spine with advanced degenerative changes. No acute osseous abnormality. Moderate severe diffuse canal stenosis C4 through C7, worst at C5-C6. As previously stated, suggest MRI cervical spine to better assess the canal and cord Electronically Signed   By: Luke Bun M.D.   On: 02/12/2024 22:37   DG Shoulder Left Result Date: 02/12/2024 CLINICAL DATA:  Status post fall. EXAM: LEFT SHOULDER - 2+ VIEW COMPARISON:  April 11, 2014 FINDINGS: There is no  evidence of fracture or dislocation. Degenerative changes seen involving the left acromioclavicular joint and left glenohumeral articulation. Soft tissues are unremarkable. IMPRESSION: Degenerative changes without evidence of acute fracture or dislocation. Electronically Signed   By: Suzen Dials M.D.   On: 02/12/2024 21:05   CT Head Wo Contrast Result Date: 02/12/2024 CLINICAL DATA:  Polytrauma, blunt EXAM: CT HEAD WITHOUT CONTRAST CT CERVICAL SPINE WITHOUT CONTRAST TECHNIQUE: Multidetector CT imaging of the head and cervical spine was performed following the standard protocol without intravenous contrast. Multiplanar CT image reconstructions of the cervical spine were also generated. RADIATION DOSE REDUCTION: This exam was performed according to the departmental dose-optimization program which includes automated exposure control, adjustment of the mA and/or kV according to patient size and/or use of iterative reconstruction technique. COMPARISON:  None Available. FINDINGS: CT HEAD FINDINGS Brain: Patchy and confluent areas of decreased attenuation are noted throughout the deep and periventricular white matter of the cerebral hemispheres bilaterally, compatible with chronic microvascular ischemic disease. No evidence of large-territorial acute infarction. No parenchymal hemorrhage. No mass lesion. No extra-axial collection. No mass effect or midline shift. No hydrocephalus. Basilar cisterns are patent. Vascular: No hyperdense vessel. Atherosclerotic calcifications are present within the cavernous internal carotid arteries. Skull: No acute fracture or focal lesion. Sinuses/Orbits: Paranasal sinuses and mastoid air cells are clear. Bilateral lens replacement. Otherwise the orbits are unremarkable. Other: None. CT CERVICAL SPINE FINDINGS Alignment: Normal. Skull base and vertebrae: Multilevel severe degenerative change of the spine with posterior disc osteophyte complex formation at the C3-C4, C4-C5, C5-C6,  C6-C7 levels with associated at least moderate osseous central canal stenosis at the C5-C6 level. Severe osseous neural foraminal stenosis at the C4-C5, C5-C6, C6-C7, C7-T1 levels. No acute fracture. No aggressive appearing focal osseous lesion or focal pathologic process. Soft tissues and spinal canal: No prevertebral fluid or swelling. No visible canal hematoma. Upper chest: Unremarkable. Other: None. IMPRESSION: 1. No acute intracranial abnormality. 2. No acute displaced fracture or traumatic listhesis of the cervical spine. 3. 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 Recommend outpatient MRI cervical spine for further evaluation. Electronically Signed   By: Morgane  Naveau M.D.   On: 02/12/2024 13:28   CT Cervical Spine Wo Contrast Result Date: 02/12/2024 CLINICAL DATA:  Polytrauma, blunt EXAM: CT HEAD WITHOUT CONTRAST CT CERVICAL SPINE WITHOUT CONTRAST TECHNIQUE: Multidetector CT imaging of the head and cervical spine was performed following the standard protocol without intravenous contrast. Multiplanar CT image reconstructions of the cervical spine were also generated. RADIATION DOSE REDUCTION: This exam was performed according to the departmental dose-optimization program which includes automated exposure control, adjustment of the mA and/or kV according to patient size and/or use of iterative reconstruction technique. COMPARISON:  None Available. FINDINGS: CT HEAD FINDINGS Brain: Patchy and confluent areas of decreased attenuation are noted throughout the deep and periventricular white matter of the cerebral hemispheres bilaterally, compatible with chronic microvascular ischemic disease. No evidence of large-territorial acute infarction. No parenchymal hemorrhage. No mass lesion. No extra-axial  collection. No mass effect or midline shift. No hydrocephalus. Basilar cisterns are patent. Vascular: No hyperdense vessel.  Atherosclerotic calcifications are present within the cavernous internal carotid arteries. Skull: No acute fracture or focal lesion. Sinuses/Orbits: Paranasal sinuses and mastoid air cells are clear. Bilateral lens replacement. Otherwise the orbits are unremarkable. Other: None. CT CERVICAL SPINE FINDINGS Alignment: Normal. Skull base and vertebrae: Multilevel severe degenerative change of the spine with posterior disc osteophyte complex formation at the C3-C4, C4-C5, C5-C6, C6-C7 levels with associated at least moderate osseous central canal stenosis at the C5-C6 level. Severe osseous neural foraminal stenosis at the C4-C5, C5-C6, C6-C7, C7-T1 levels. No acute fracture. No aggressive appearing focal osseous lesion or focal pathologic process. Soft tissues and spinal canal: No prevertebral fluid or swelling. No visible canal hematoma. Upper chest: Unremarkable. Other: None. IMPRESSION: 1. No acute intracranial abnormality. 2. No acute displaced fracture or traumatic listhesis of the cervical spine. 3. 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 Recommend outpatient MRI cervical spine for further evaluation. Electronically Signed   By: Morgane  Naveau M.D.   On: 02/12/2024 13:28   DG Chest Portable 1 View Result Date: 02/12/2024 CLINICAL DATA:  weakness EXAM: PORTABLE CHEST 1 VIEW COMPARISON:  None Available. FINDINGS: The heart and mediastinal contours are within normal limits. Mild atherosclerotic plaque. No focal consolidation. No pulmonary edema. No pleural effusion. No pneumothorax. No acute osseous abnormality. Degenerative changes of the bilateral shoulders. IMPRESSION: 1. No active disease. 2.  Aortic Atherosclerosis (ICD10-I70.0). Electronically Signed   By: Morgane  Naveau M.D.   On: 02/12/2024 13:14     Procedures   Medications Ordered in the ED  fentaNYL  (SUBLIMAZE ) injection 12.5 mcg (has no administration in time range)   fentaNYL  (SUBLIMAZE ) injection 25 mcg (has no administration in time range)  acetaminophen  (TYLENOL ) tablet 500 mg (has no administration in time range)  amLODipine  (NORVASC ) tablet 5 mg (has no administration in time range)  atorvastatin  (LIPITOR) tablet 20 mg (has no administration in time range)  famotidine  (PEPCID ) tablet 20 mg (has no administration in time range)  Basaglar  KwikPen KwikPen 12-15 Units (has no administration in time range)  losartan  (COZAAR ) tablet 100 mg (has no administration in time range)  metFORMIN  (GLUCOPHAGE ) tablet 500 mg (has no administration in time range)                                    Medical Decision Making Patient presents after mechanical fall in the context of recent COVID diagnosis.  Patient's evaluation of the day reviewed, she has mild tachycardia, but no chest pain, no dyspnea, presents after fall that she recalls will fall detail. Concern for musculoskeletal injuries, intracranial abnormality, labs reviewed from prior. Cardiac 95, 105 sinus borderline Pulse ox 99% room air normal  Amount and/or Complexity of Data Reviewed Independent Historian: EMS External Data Reviewed: notes. Radiology: ordered and independent interpretation performed. Decision-making details documented in ED Course.  Risk OTC drugs. Prescription drug management.   11:06 PM Patient accompanied by her sister.  We discussed today's evaluation earlier with COVID, and now following the fall.  Sister notes the patient has had generalized weakness for years which is corroborated by the patient.  With weakness persistent for years patient does have evidence on CT cervical spine of degenerative changes, recommendation for radiology for outpatient MRI.  Sister requests assistance with placement for the  patient, has been concerned about the patient's weakness, living alone, requests social work evaluation for consideration of placement.  Patient amenable to this request as  well.  Home orders placed    Final diagnoses:  Fall, initial encounter  Weakness     Garrick Charleston, MD 02/12/24 [id number]

## 2024-02-12 NOTE — Discharge Instructions (Signed)
 Continue Tylenol  1000 mg every 6 hours as needed for body aches and fever.  Stay well-hydrated and rest.  If you are having worsening symptoms fever lasting more than 5 days please return for evaluation or follow-up with your primary care doctor.

## 2024-02-12 NOTE — ED Triage Notes (Signed)
 Pt reports leaning over and falling, landing on carpet. Pt denies any injury. Pt reports being unable to get herself off and reports increased generalized weakness. Pt reports feeling feverish this AM and took some Tylenol .

## 2024-02-12 NOTE — ED Provider Notes (Signed)
 Yesenia Meyers Provider Note   CSN: [id number] Arrival date & time: 02/12/24  1219     Patient presents with: Fatigue (\/)   Yesenia Meyers is a 73 y.o. female.   Patient here with bodyaches sore throat fever generalized weakness since this morning.  She woke up around 4 AM with bodyaches and chills took Tylenol  and back to bed.  Felt a little bit better when she woke up this morning.  She felt little lightheaded and weak when trying to bend over today and fell onto the carpet with her walker.  She did not hit her head or lose consciousness.  She states that she can gradually brought herself to the ground.  She is not having any extremity pain headache or neck pain.  She does not take any blood thinners.  She feels body aches and weakness in her legs and her general body.  She denies any cough sputum production pain with urination.  She is not on any immunocompromising medications.  No chemotherapy.  She has a history of diabetes hypertension high cholesterol.  Has been feeling well prior to this morning.  The history is provided by the patient.       Prior to Admission medications   Medication Sig Start Date End Date Taking? Authorizing Provider  acetaminophen  (TYLENOL ) 500 MG tablet Take 500 mg by mouth every 6 (six) hours as needed (Pt does not take unless severe pain.).     [provider]  amLODipine  (NORVASC ) 5 MG tablet Take 5 mg by mouth daily. 06/16/21   [provider]  atorvastatin  (LIPITOR) 20 MG tablet Take 20 mg by mouth daily.    [provider]  Dulaglutide (TRULICITY [place]) Inject 0.5 Units into the skin once a week.    [provider]  Insulin  Glargine (BASAGLAR  KWIKPEN) 100 UNIT/ML Inject 12-15 Units into the skin 2 (two) times daily. 12 units in the AM and 15 units in the evening 06/27/21   Sheikh, Omair Latif, DO  losartan  (COZAAR ) 100 MG tablet Take 1 tablet (100 mg total) by mouth daily.  06/28/21   Sherrill Cable Latif, DO  metFORMIN  (GLUCOPHAGE ) 500 MG tablet Take 500 mg by mouth 2 (two) times daily with a meal.    [provider]  Multiple Vitamin (MULTI-VITAMIN DAILY PO) Take 1 tablet by mouth daily.    [provider]  Omega-3 Fatty Acids (FISH OIL) 1000 MG CAPS Take 1,000 mg by mouth daily.    [provider]  omeprazole  (PRILOSEC) 40 MG capsule Take 1 capsule (40 mg total) by mouth daily. 09/26/23   Luke Orlan HERO, DO  Sodium Sulfate-Mag Sulfate-KCl (SUTAB ) 1479-225-188 MG TABS TAKE AS DIRECTED 05/17/22   Charlanne Groom, MD    Allergies: Celebrex [celecoxib], Feldene [piroxicam], and Voltaren [diclofenac sodium]    Review of Systems  Updated Vital Signs BP (!) 120/106   Pulse 90   Temp 99.6 F (37.6 C) (Oral)   Resp (!) 23   Ht 4' 11 (1.499 m)   Wt 72.1 kg   SpO2 98%   BMI 32.11 kg/m   Physical Exam Vitals and nursing note reviewed.  Constitutional:      General: She is not in acute distress.    Appearance: She is well-developed. She is not ill-appearing.  HENT:     Head: Normocephalic and atraumatic.     Nose: Nose normal.     Mouth/Throat:     Mouth: Mucous membranes  are moist.  Eyes:     Extraocular Movements: Extraocular movements intact.     Conjunctiva/sclera: Conjunctivae normal.     Pupils: Pupils are equal, round, and reactive to light.  Cardiovascular:     Rate and Rhythm: Normal rate and regular rhythm.     Pulses: Normal pulses.     Heart sounds: Normal heart sounds. No murmur heard. Pulmonary:     Effort: Pulmonary effort is normal. No respiratory distress.     Breath sounds: Normal breath sounds.  Abdominal:     Palpations: Abdomen is soft.     Tenderness: There is no abdominal tenderness.  Musculoskeletal:        General: No swelling.     Cervical back: Normal range of motion and neck supple.  Skin:    General: Skin is warm and dry.     Capillary Refill: Capillary refill takes less than 2 seconds.   Neurological:     General: No focal deficit present.     Mental Status: She is alert and oriented to person, place, and time.     Cranial Nerves: No cranial nerve deficit.     Sensory: No sensory deficit.     Motor: No weakness.     Coordination: Coordination normal.     Comments: Normal strength and sensation normal speech normal coordination  Psychiatric:        Mood and Affect: Mood normal.     (all labs ordered are listed, but only abnormal results are displayed) Labs Reviewed  RESP PANEL BY RT-PCR (RSV, FLU A&B, COVID)  [id number] - Abnormal; Notable for the following components:      Result Value   SARS Coronavirus 2 by RT PCR POSITIVE (*)    All other components within normal limits  CBC WITH DIFFERENTIAL/PLATELET - Abnormal; Notable for the following components:   WBC 12.7 (*)    Neutro Abs 9.1 (*)    Monocytes Absolute 1.8 (*)    Abs Immature Granulocytes 0.08 (*)    All other components within normal limits  COMPREHENSIVE METABOLIC PANEL WITH GFR - Abnormal; Notable for the following components:   Creatinine, Ser 1.31 (*)    Calcium  8.7 (*)    Total Protein 6.4 (*)    GFR, Estimated 43 (*)    All other components within normal limits  GROUP A STREP BY PCR    EKG: EKG Interpretation Date/Time:  Sunday February 12 2024 13:02:20 EDT Ventricular Rate:  93 PR Interval:  158 QRS Duration:  140 QT Interval:  375 QTC Calculation: 467 R Axis:   -72  Text Interpretation: Sinus rhythm RBBB and LAFB Probable left ventricular hypertrophy Confirmed by Ruthe Cornet [phone]) on 02/12/2024 1:07:02 PM  Radiology: CT Head Wo Contrast Result Date: 02/12/2024 CLINICAL DATA:  Polytrauma, blunt EXAM: CT HEAD WITHOUT CONTRAST CT CERVICAL SPINE WITHOUT CONTRAST TECHNIQUE: Multidetector CT imaging of the head and cervical spine was performed following the standard protocol without intravenous contrast. Multiplanar CT image reconstructions of the cervical spine were also generated. RADIATION  DOSE REDUCTION: This exam was performed according to the departmental dose-optimization program which includes automated exposure control, adjustment of the mA and/or kV according to patient size and/or use of iterative reconstruction technique. COMPARISON:  None Available. FINDINGS: CT HEAD FINDINGS Brain: Patchy and confluent areas of decreased attenuation are noted throughout the deep and periventricular white matter of the cerebral hemispheres bilaterally, compatible with chronic microvascular ischemic disease. No evidence of large-territorial acute infarction. No parenchymal hemorrhage. No  mass lesion. No extra-axial collection. No mass effect or midline shift. No hydrocephalus. Basilar cisterns are patent. Vascular: No hyperdense vessel. Atherosclerotic calcifications are present within the cavernous internal carotid arteries. Skull: No acute fracture or focal lesion. Sinuses/Orbits: Paranasal sinuses and mastoid air cells are clear. Bilateral lens replacement. Otherwise the orbits are unremarkable. Other: None. CT CERVICAL SPINE FINDINGS Alignment: Normal. Skull base and vertebrae: Multilevel severe degenerative change of the spine with posterior disc osteophyte complex formation at the C3-C4, C4-C5, C5-C6, C6-C7 levels with associated at least moderate osseous central canal stenosis at the C5-C6 level. Severe osseous neural foraminal stenosis at the C4-C5, C5-C6, C6-C7, C7-T1 levels. No acute fracture. No aggressive appearing focal osseous lesion or focal pathologic process. Soft tissues and spinal canal: No prevertebral fluid or swelling. No visible canal hematoma. Upper chest: Unremarkable. Other: None. IMPRESSION: 1. No acute intracranial abnormality. 2. No acute displaced fracture or traumatic listhesis of the cervical spine. 3. 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 Recommend outpatient MRI cervical spine for further  evaluation. Electronically Signed   By: Morgane  Naveau M.D.   On: 02/12/2024 13:28   CT Cervical Spine Wo Contrast Result Date: 02/12/2024 CLINICAL DATA:  Polytrauma, blunt EXAM: CT HEAD WITHOUT CONTRAST CT CERVICAL SPINE WITHOUT CONTRAST TECHNIQUE: Multidetector CT imaging of the head and cervical spine was performed following the standard protocol without intravenous contrast. Multiplanar CT image reconstructions of the cervical spine were also generated. RADIATION DOSE REDUCTION: This exam was performed according to the departmental dose-optimization program which includes automated exposure control, adjustment of the mA and/or kV according to patient size and/or use of iterative reconstruction technique. COMPARISON:  None Available. FINDINGS: CT HEAD FINDINGS Brain: Patchy and confluent areas of decreased attenuation are noted throughout the deep and periventricular white matter of the cerebral hemispheres bilaterally, compatible with chronic microvascular ischemic disease. No evidence of large-territorial acute infarction. No parenchymal hemorrhage. No mass lesion. No extra-axial collection. No mass effect or midline shift. No hydrocephalus. Basilar cisterns are patent. Vascular: No hyperdense vessel. Atherosclerotic calcifications are present within the cavernous internal carotid arteries. Skull: No acute fracture or focal lesion. Sinuses/Orbits: Paranasal sinuses and mastoid air cells are clear. Bilateral lens replacement. Otherwise the orbits are unremarkable. Other: None. CT CERVICAL SPINE FINDINGS Alignment: Normal. Skull base and vertebrae: Multilevel severe degenerative change of the spine with posterior disc osteophyte complex formation at the C3-C4, C4-C5, C5-C6, C6-C7 levels with associated at least moderate osseous central canal stenosis at the C5-C6 level. Severe osseous neural foraminal stenosis at the C4-C5, C5-C6, C6-C7, C7-T1 levels. No acute fracture. No aggressive appearing focal osseous  lesion or focal pathologic process. Soft tissues and spinal canal: No prevertebral fluid or swelling. No visible canal hematoma. Upper chest: Unremarkable. Other: None. IMPRESSION: 1. No acute intracranial abnormality. 2. No acute displaced fracture or traumatic listhesis of the cervical spine. 3. 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 Recommend outpatient MRI cervical spine for further evaluation. Electronically Signed   By: Morgane  Naveau M.D.   On: 02/12/2024 13:28   DG Chest Portable 1 View Result Date: 02/12/2024 CLINICAL DATA:  weakness EXAM: PORTABLE CHEST 1 VIEW COMPARISON:  None Available. FINDINGS: The heart and mediastinal contours are within normal limits. Mild atherosclerotic plaque. No focal consolidation. No pulmonary edema. No pleural effusion. No pneumothorax. No acute osseous abnormality. Degenerative changes of the bilateral shoulders. IMPRESSION: 1. No active  disease. 2.  Aortic Atherosclerosis (ICD10-I70.0). Electronically Signed   By: Morgane  Naveau M.D.   On: 02/12/2024 13:14     Procedures   Medications Ordered in the ED  acetaminophen  (TYLENOL ) tablet 1,000 mg (1,000 mg Oral Given 02/12/24 1322)  sodium chloride  0.9 % bolus 1,000 mL (0 mLs Intravenous Stopped 02/12/24 1421)                                    Medical Decision Making Amount and/or Complexity of Data Reviewed Labs: ordered. Radiology: ordered.  Risk OTC drugs.   Yesenia Meyers is here with general fatigue.  History of diabetes hypertension high cholesterol.  She woke up this morning feeling febrile and chills.  She got a little bit weak in her legs and fell to the ground prior to arrival here.  She took Tylenol  earlier this morning as well.  She did not hit her head or lose consciousness.  But she is having little bit of discomfort in her neck area and upper back from the fall.  She denies any extremity pain otherwise.  She  feels like her legs are just weak.  She has sore throat body aches and chills.  No sick contacts.  Rectal temperature was normal albeit 99.5.  Otherwise vitals are unremarkable.  Differential diagnosis likely viral process versus strep throat versus less likely pneumonia UTI.  Will evaluate for electrolyte abnormalities dehydration/AKI.  She denies any black or bloody stools.  She denies any chest pain or shortness of breath.  EKG shows sinus rhythm.  No ischemic changes.  She is neurologically intact.  I have no concern for stroke.  Will check basic labs get a CT scan of her head and neck and chest x-ray and urinalysis give IV fluids give Tylenol  and reevaluate.  Overall patient positive for COVID.  Chest x-ray negative for pneumonia per radiology report.  She has no significant leukocytosis anemia.  Strep test is negative.  Head and neck CT are unremarkable.  Overall she is feeling better after IV fluids.  I believe her symptoms are secondary to viral process.  Continue supportive care at home with Tylenol  rest hydration follow-up with primary care told to return if symptoms worsen.  Patient discharged.  This chart was dictated using voice recognition software.  Despite best efforts to proofread,  errors can occur which can change the documentation meaning.      Final diagnoses:  COVID-19    ED Discharge Orders     None          Ruthe Cornet, DO 02/12/24 1451

## 2024-02-13 LAB — CBG MONITORING, ED
Glucose-Capillary: 139 mg/dL — ABNORMAL HIGH (ref 70–99)
Glucose-Capillary: 127 mg/dL — ABNORMAL HIGH (ref 70–99)

## 2024-02-13 MED ORDER — INSULIN GLARGINE-YFGN 100 UNIT/ML ~~LOC~~ SOLN
10.0000 [IU] | Freq: Two times a day (BID) | SUBCUTANEOUS | Status: DC
  Administered 2024-02-13: 10 [IU] via SUBCUTANEOUS
  Filled 2024-02-13 (×2): qty 0.1

## 2024-02-13 MED ORDER — ONDANSETRON 4 MG PO TBDP
4.0000 mg | ORAL_TABLET | Freq: Four times a day (QID) | ORAL | Status: DC | PRN
  Administered 2024-02-13: 4 mg via ORAL
  Filled 2024-02-13: qty 1

## 2024-02-13 NOTE — Discharge Instructions (Addendum)
 A referral has been made for home health physical therapy services to [facility] [phone]). The agency will contact you directly to initiate services.

## 2024-02-13 NOTE — Evaluation (Signed)
 Physical Therapy Evaluation Patient Details Name: Yesenia Meyers MRN: [id number] DOB: [birth year] Today's Date: 02/13/2024  History of Present Illness  73 yo female presents to therapy following arrival to ED 02/12/2024 from ALF due to mechanical fall resulting in L shoulder and paraspinal pain. Imaging of head, neck, chest and L shoulder negative for acute findings. Pt dx with COVID at [facility] ED 8/3 prior to fall. Pt PMH includes but is not limited to: arthritis, anxiety, atypical angina, DOE, DM II, HTN, HLD, asthma, URI, and eczema.  Clinical Impression      Pt admitted with above diagnosis.  Pt currently with functional limitations due to the deficits listed below (see PT Problem List). Pt in bed when PT arrived to ED. Pt agreeable to therapy intervention. Pt reports a series of 4 falls in the past month and is conserned. Pt exhibits generalized weakness secondary to covid and endorses L shoulder pain. Pt reported SOB s/p gait in room 20 feet with RW and S pt on RA and O2 saturation 97%. Pt is mod I for supine to sit and required some assist to return to stretcher secondary to stature. Pt will benefit from youth RW for use in home setting  for improve stability. Pt required S for transfer tasks. Pt did not report dizziness with positional changes. Pt left in bed all needs in place. Pt will benefit from [facility] services following hospital d/c. Pt will benefit from acute skilled PT to increase their independence and safety with mobility to allow discharge.       If plan is discharge home, recommend the following: A little help with walking and/or transfers;Assistance with cooking/housework;Assist for transportation   Can travel by private vehicle        Equipment Recommendations Rolling walker (2 wheels) (Youth height)  Recommendations for Other Services       Functional Status Assessment Patient has had a recent decline in their functional status and demonstrates the ability to make significant  improvements in function in a reasonable and predictable amount of time.     Precautions / Restrictions Precautions Precautions: Fall      Mobility  Bed Mobility Overal bed mobility: Needs Assistance Bed Mobility: Supine to Sit, Sit to Supine     Supine to sit: Modified independent (Device/Increase time) Sit to supine: Contact guard assist   General bed mobility comments: pt on stretcher in ED and requried some assist to return to bed due to short stature and stretcher not adjustable for height, min cues    Transfers Overall transfer level: Needs assistance Equipment used: Rolling walker (2 wheels) Transfers: Sit to/from Stand Sit to Stand: Supervision           General transfer comment: min cues due to height of stretcher and min A to return to sitting on stretcher    Ambulation/Gait Ambulation/Gait assistance: Supervision Gait Distance (Feet): 20 Feet Assistive device: Rolling walker (2 wheels) Gait Pattern/deviations: Step-to pattern, Decreased step length - right, Decreased step length - left, Wide base of support Gait velocity: decreased     General Gait Details: short stride length B toe out limited foot clearance and B UE support at RW with slight trunk flexion and min cues  Stairs            Wheelchair Mobility     Tilt Bed    Modified Rankin (Stroke Patients Only)       Balance Overall balance assessment: History of Falls, Mild deficits observed, not formally tested (4  falls in the past month)                                           Pertinent Vitals/Pain Pain Assessment Pain Assessment: 0-10 Pain Score: 7  Pain Location: L shoulder Pain Descriptors / Indicators: Aching, Constant, Throbbing Pain Intervention(s): Limited activity within patient's tolerance, Premedicated before session, Monitored during session    Home Living Family/patient expects to be discharged to:: Private residence Living Arrangements:  Alone Available Help at Discharge: Family Type of Home: Apartment Home Access: Level entry;Elevator       Home Layout: One level Home Equipment: Rollator (4 wheels);Cane - single point Additional Comments: pt resides at [employer] ILF apartment    Prior Function Prior Level of Function : Independent/Modified Independent;Driving             Mobility Comments: pt indicates she furniture surfs in the apartment, sometimes uses a SPC or rollator ADLs Comments: mod I for all ADLs, self care tasks and IADLs     Extremity/Trunk Assessment        Lower Extremity Assessment Lower Extremity Assessment: Generalized weakness    Cervical / Trunk Assessment Cervical / Trunk Assessment: Normal  Communication   Communication Communication: No apparent difficulties    Cognition Arousal: Alert Behavior During Therapy: WFL for tasks assessed/performed   PT - Cognitive impairments: No apparent impairments                         Following commands: Intact       Cueing       General Comments      Exercises     Assessment/Plan    PT Assessment Patient needs continued PT services  PT Problem List Decreased strength;Decreased activity tolerance;Decreased balance;Decreased mobility;Decreased coordination;Pain       PT Treatment Interventions DME instruction;Gait training;Functional mobility training;Therapeutic activities;Therapeutic exercise;Balance training;Neuromuscular re-education;Patient/family education    PT Goals (Current goals can be found in the Care Plan section)  Acute Rehab PT Goals Patient Stated Goal: to figure out why I am falling PT Goal Formulation: With patient Time For Goal Achievement: 02/27/24 Potential to Achieve Goals: Good    Frequency Min 3X/week     Co-evaluation               AM-PAC PT 6 Clicks Mobility  Outcome Measure Help needed turning from your back to your side while in a flat bed without using bedrails?:  None Help needed moving from lying on your back to sitting on the side of a flat bed without using bedrails?: None Help needed moving to and from a bed to a chair (including a wheelchair)?: A Little Help needed standing up from a chair using your arms (e.g., wheelchair or bedside chair)?: A Little Help needed to walk in hospital room?: A Little Help needed climbing 3-5 steps with a railing? : A Lot 6 Click Score: 19    End of Session Equipment Utilized During Treatment: Gait belt Activity Tolerance: Patient limited by fatigue Patient left: in bed;with call bell/phone within reach Nurse Communication: Mobility status PT Visit Diagnosis: Other abnormalities of gait and mobility (R26.89);Unsteadiness on feet (R26.81);Repeated falls (R29.6);Difficulty in walking, not elsewhere classified (R26.2);Pain Pain - Right/Left: Left Pain - part of body: Shoulder    Time: 1004-1030 PT Time Calculation (min) (ACUTE ONLY): 26 min   Charges:  PT Evaluation $PT Eval Low Complexity: 1 Low PT Treatments $Therapeutic Activity: 8-22 mins PT General Charges $$ ACUTE PT VISIT: 1 Visit         [place], PT Acute Rehab   [place] VEAR Drone 02/13/2024, 10:46 AM

## 2024-02-13 NOTE — Progress Notes (Addendum)
 CSW spoke with Tamera at [facility] who states the facility has a preferred [facility] which is [facility] [phone]).  CSW attempted to reach staff at [facility] without success - a voicemail was left requesting a return call.  CSW spoke with MD to request a [facility] PT order be placed.  CSW faxed [facility] PT order to [facility] at [phone].   CSW spoke with patient who states she is agreeable for [facility] PT services through [facility]. Patient states her sister Diane will take her back to her apartment and that she will call her to arrange the ride. Patient did not have any additional questions or concerns to address at this time.  Niels Portugal, MSW, LCSW Transitions of Care  Clinical Social Worker II [phone]

## 2024-02-13 NOTE — ED Provider Notes (Signed)
 I was asked to discharge the patient after she has home health services ordered for home.  She is discharged with return precautions.   Ula Prentice SAUNDERS, MD 02/13/24 1400

## 2024-02-17 DIAGNOSIS — G629 Polyneuropathy, unspecified: Secondary | ICD-10-CM | POA: Diagnosis not present

## 2024-02-17 DIAGNOSIS — R296 Repeated falls: Secondary | ICD-10-CM | POA: Diagnosis not present

## 2024-02-17 DIAGNOSIS — R2681 Unsteadiness on feet: Secondary | ICD-10-CM | POA: Diagnosis not present

## 2024-02-20 DIAGNOSIS — R2689 Other abnormalities of gait and mobility: Secondary | ICD-10-CM | POA: Diagnosis not present

## 2024-02-20 DIAGNOSIS — M6281 Muscle weakness (generalized): Secondary | ICD-10-CM | POA: Diagnosis not present

## 2024-02-22 DIAGNOSIS — G252 Other specified forms of tremor: Secondary | ICD-10-CM | POA: Diagnosis not present

## 2024-02-22 DIAGNOSIS — K219 Gastro-esophageal reflux disease without esophagitis: Secondary | ICD-10-CM | POA: Diagnosis not present

## 2024-02-22 DIAGNOSIS — R2689 Other abnormalities of gait and mobility: Secondary | ICD-10-CM | POA: Diagnosis not present

## 2024-02-22 DIAGNOSIS — R7989 Other specified abnormal findings of blood chemistry: Secondary | ICD-10-CM | POA: Diagnosis not present

## 2024-02-22 DIAGNOSIS — R944 Abnormal results of kidney function studies: Secondary | ICD-10-CM | POA: Diagnosis not present

## 2024-02-23 ENCOUNTER — Encounter: Payer: Self-pay | Admitting: Neurology

## 2024-02-24 DIAGNOSIS — R2689 Other abnormalities of gait and mobility: Secondary | ICD-10-CM | POA: Diagnosis not present

## 2024-02-24 DIAGNOSIS — M6281 Muscle weakness (generalized): Secondary | ICD-10-CM | POA: Diagnosis not present

## 2024-02-27 DIAGNOSIS — M6281 Muscle weakness (generalized): Secondary | ICD-10-CM | POA: Diagnosis not present

## 2024-02-27 DIAGNOSIS — R2689 Other abnormalities of gait and mobility: Secondary | ICD-10-CM | POA: Diagnosis not present

## 2024-02-28 ENCOUNTER — Other Ambulatory Visit: Payer: Self-pay

## 2024-02-28 DIAGNOSIS — H9191 Unspecified hearing loss, right ear: Secondary | ICD-10-CM | POA: Insufficient documentation

## 2024-02-28 DIAGNOSIS — H35039 Hypertensive retinopathy, unspecified eye: Secondary | ICD-10-CM | POA: Insufficient documentation

## 2024-02-28 DIAGNOSIS — Z794 Long term (current) use of insulin: Secondary | ICD-10-CM | POA: Insufficient documentation

## 2024-02-28 DIAGNOSIS — D472 Monoclonal gammopathy: Secondary | ICD-10-CM | POA: Insufficient documentation

## 2024-02-28 DIAGNOSIS — N1831 Chronic kidney disease, stage 3a: Secondary | ICD-10-CM | POA: Insufficient documentation

## 2024-02-28 DIAGNOSIS — E1136 Type 2 diabetes mellitus with diabetic cataract: Secondary | ICD-10-CM | POA: Insufficient documentation

## 2024-02-28 DIAGNOSIS — E1165 Type 2 diabetes mellitus with hyperglycemia: Secondary | ICD-10-CM | POA: Insufficient documentation

## 2024-02-28 DIAGNOSIS — D72829 Elevated white blood cell count, unspecified: Secondary | ICD-10-CM | POA: Insufficient documentation

## 2024-02-28 DIAGNOSIS — M170 Bilateral primary osteoarthritis of knee: Secondary | ICD-10-CM | POA: Insufficient documentation

## 2024-02-28 DIAGNOSIS — M19049 Primary osteoarthritis, unspecified hand: Secondary | ICD-10-CM | POA: Insufficient documentation

## 2024-02-28 NOTE — Progress Notes (Unsigned)
 Assessment/Plan:  1.  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  She is looking into possibly assisted living facilities.  -We decided to add carbidopa /levodopa  25/100.  1/2 tab tid x 1 wk, then 1/2 in am & noon & 1 at night for a week, then 1/2 in am &1 at noon &night for a week, then 1 po tid at 7 AM/11 AM/4 PM.  Risks, benefits, side effects and alternative therapies were discussed.  The opportunity to ask questions was given and they were answered to the best of my ability.  The patient expressed understanding and willingness to follow the outlined treatment protocols.  - Patient is already involved in physical and speech therapy.  Discussed hypophonic speech and the role of Parkinson's disease plays into this.  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  -We discussed community resources in the area including patient support groups and community exercise programs for PD and pt education was provided to the patient.   - She asked me about driving.  Her reflex and reaction time appears to be quite slow.  I told her I would recommend a driving evaluation at [facility].  She noted that she just needs to be told not to drive as I do not drive anyway.  She did not want to proceed with the evaluation, so I agree that she should not be driving.   2.  Ataxia  - She is a bit more ataxic than I would expect for pure parkinsonism, but also admits that some of this is just fear of falling since she fell a few weeks ago when she had COVID.  She was not using a walker prior to that, but now is scared just to let go of the walker.  - We will proceed with MRI brain. Discussed with patient that the MRI in this age group often will show hardening of the arteries/cerebral small vessel disease and atrophy of the brain.  Discussed that this is not uncommon and would be an incidental finding in this case.  We are ordering  the MRI to make sure that we are not missing other nonincidental findings.   3.  Dysphagia  -mbe will be ordered.  She was agreeable, although very hesitant because she has already seen ENT and has had some test.  She has not had a modified barium swallow.  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  In addition, treatment for instability due to diabetic peripheral neuropathy would be physical therapy.  5.  Hx diarrhea  - She asked if Parkinsons would cause diarrhea, and I told her that it would cause exactly the opposite, that being constipation.  I told her that it could be from the metformin , and she admitted that higher dosages of metformin  had caused diarrhea in the past.  She states that the diarrhea is now more intermittent, and often associated with eating poorly, including chocolate.   Subjective:   Yesenia Meyers was seen today in the movement disorders clinic for neurologic consultation at the request of Dayna Motto, DO.  The consultation is for the evaluation of gait instability and the report of resting tremor, which was noted by her sister.  The evaluation is to rule out Parkinson's disease.  She is accompanied today  by her sister who supplements the history.  She is R hand dominant.   Specific Symptoms:  Tremor: Yes.  sister notes it in R hand after walking around a lot or after exercise and she is tired or after she has fallen - pt doesn't note it; she is R hand dominant.  Sometimes pt will note tremor in neck/head when relaxed.  Notes no tremor with eating but she does noting dropping objects with eating Family hx of similar:  No. Voice: She is seeing ENT.  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  She reports she is doing therapy exercises at home Sleep:   Vivid Dreams:  Yes.    Acting out dreams:  Yes.    - no falling out of the bed Wet Pillows: Yes.   Postural symptoms:  Yes.    Falls?  Yes.  Patient was in the emergency room August 3 after a fall at assisted living.  She had been at the emergency room earlier that day with complaints of fatigue and patient was found to be positive for COVID.  This was the last fall. Primary care also notes concerns about diabetic neuropathy that could have been causing issues with balance.  Cannot recall the fall prior to that but states that she can have a few falls and then may go a year without falling.   Bradykinesia symptoms: no trouble getting out of low chair/car but does have some trouble getting out of high vehicle; she denies shuffle but sister states that if she is out walking for a while, she will shuffle Loss of smell:  its decreased some Loss of taste:  its decreased some Urinary Incontinence:  no but has urgency and wears depends b/c of that but has had no accidents Difficulty Swallowing:  Yes.  , all consistencies of food Handwriting, micrographia: it can be larger or smaller Trouble with ADL's:  No.  Trouble buttoning clothing: sometimes Depression:  No. But has some anxiety lately Memory changes:  No. Hallucinations:  No.  visual distortions: No. But does occ have floaters.   N/V:  No. Lightheaded:  some if gets up quick  Syncope: No. Diplopia:  No. Dyskinesia:  No. Prior exposure to reglan/antipsychotics: No.  Neuroimaging of the brain has no previously been performed.     ALLERGIES:   Allergies  Allergen Reactions   Celebrex [Celecoxib] Rash   Feldene [Piroxicam] Swelling   Voltaren [Diclofenac Sodium] Rash    CURRENT MEDICATIONS:  Current Meds  Medication Sig   amLODipine  (NORVASC ) 5 MG tablet Take 5 mg by mouth daily.   atorvastatin  (LIPITOR) 20 MG tablet Take 20 mg by mouth daily.   carbidopa -levodopa  (SINEMET  IR) 25-100 MG tablet Take 1 tablet by mouth 3 (three) times daily. 7am/11am/4pm   Dulaglutide (TRULICITY) 1.5  MG/0.5ML SOAJ Inject 1.5mg  Subcutaneous once a week; Duration: 84 days   famotidine  (PEPCID ) 20 MG tablet Take 20 mg by mouth at bedtime as needed.   Insulin  Glargine (BASAGLAR  KWIKPEN) 100 UNIT/ML Inject 12-15 Units into the skin 2 (two) times daily. 12 units in the AM and 15 units in the evening   losartan  (COZAAR ) 100 MG tablet Take 1 tablet (100 mg total) by mouth daily.   metFORMIN  (GLUCOPHAGE ) 500 MG tablet Take 500-1,000 mg by mouth 2 (two) times daily with a meal. Take 1 tablet (500 mg) in the morning and Take 2 tablets (1000 mg)  in the evening.   Multiple Vitamin (MULTIVITAMIN ADULT PO) 1 tablet by mouth Once daily     Objective:   VITALS:   Vitals:   02/29/24 0837  BP: (!) 140/80  Pulse: 88  Resp: 20  SpO2: 98%  Weight: 156 lb (70.8 kg)  Height: 5' (1.524 m)    GEN:  The patient appears stated age and is in NAD. HEENT:  Normocephalic, atraumatic.  The mucous membranes are moist. The superficial temporal arteries are without ropiness or tenderness. CV:  RRR Lungs:  CTAB Neck/HEME:  There are no carotid bruits bilaterally.  Neurological examination:  Orientation: The patient is alert and oriented x3.  Cranial nerves: There is good facial symmetry. There is facial hypomimia.  Extraocular muscles are intact. The visual fields are full to confrontational testing. The speech is fluent and clear. Soft palate rises symmetrically and there is no tongue deviation. Hearing is intact to conversational tone. Sensation: Sensation is intact to light and pinprick throughout (facial, trunk, extremities). Vibration is intact at the bilateral big toe but decreased. There is no extinction with double simultaneous stimulation. There is no sensory dermatomal level identified. Motor: Strength is 5/5 in the bilateral upper and lower extremities.   Shoulder shrug is equal and symmetric.  There is no pronator drift. Deep tendon reflexes: Deep tendon reflexes are 2/4 at the bilateral biceps, triceps,  brachioradialis, patella and achilles. Plantar responses are downgoing bilaterally.  Movement examination: Tone: There is mild increased tone in the RUE but she does have trouble relaxing Abnormal movements: mild bilateral LE tremor when distracted Coordination:  There is mild decremation with RAM's, with any form of RAMS, including alternating supination and pronation of the forearm, hand opening and closing, finger taps, heel taps and toe taps bilaterally Gait and Station: The patient has difficulty arising out of a deep-seated chair without the use of the hands and is unable to do this.  She easily pushes off to arise however.  She ambulates with her 3 wheeled walker and does fairly well with that.  I did have her try to walk without the walker, and she felt uncomfortable without holding onto the examiner.  She is wide-based and ataxic. I have reviewed and interpreted the following labs independently   Chemistry      Component Value Date/Time   NA 139 02/12/2024 1421   NA 142 01/06/2017 1611   K 3.8 02/12/2024 1421   K 3.7 01/06/2017 1611   CL 103 02/12/2024 1421   CO2 23 02/12/2024 1421   CO2 28 01/06/2017 1611   BUN 18 02/12/2024 1421   BUN 13.4 01/06/2017 1611   CREATININE 1.31 (H) 02/12/2024 1421   CREATININE 1.18 (H) 04/06/2022 1259   CREATININE 1.0 01/06/2017 1611      Component Value Date/Time   CALCIUM  8.7 (L) 02/12/2024 1421   CALCIUM  10.2 01/06/2017 1611   ALKPHOS 62 02/12/2024 1421   ALKPHOS 81 01/06/2017 1611   AST 23 02/12/2024 1421   AST 15 04/06/2022 1259   AST 27 01/06/2017 1611   ALT 20 02/12/2024 1421   ALT 13 04/06/2022 1259   ALT 31 01/06/2017 1611   BILITOT 0.5 02/12/2024 1421   BILITOT 0.6 04/06/2022 1259   BILITOT 0.88 01/06/2017 1611      Lab Results  Component Value Date   TSH 1.672 06/27/2021   Lab Results  Component Value Date   WBC 12.7 (H) 02/12/2024   HGB 13.1 02/12/2024   HCT 39.2 02/12/2024  MCV 89.5 02/12/2024   PLT 228  02/12/2024     Total time spent on today's visit was 80 minutes, including both face-to-face time and nonface-to-face time.  Time included that spent on review of records (prior notes available to me/labs/imaging if pertinent), discussing treatment and goals, answering patient's questions and coordinating care.  Cc:  Dayna Motto, DO

## 2024-02-29 ENCOUNTER — Ambulatory Visit: Admitting: Neurology

## 2024-02-29 ENCOUNTER — Encounter: Payer: Self-pay | Admitting: Neurology

## 2024-02-29 VITALS — BP 140/80 | HR 88 | Resp 20 | Ht 60.0 in | Wt 156.0 lb

## 2024-02-29 DIAGNOSIS — R1319 Other dysphagia: Secondary | ICD-10-CM | POA: Diagnosis not present

## 2024-02-29 DIAGNOSIS — R197 Diarrhea, unspecified: Secondary | ICD-10-CM | POA: Diagnosis not present

## 2024-02-29 DIAGNOSIS — G20A1 Parkinson's disease without dyskinesia, without mention of fluctuations: Secondary | ICD-10-CM

## 2024-02-29 DIAGNOSIS — E1142 Type 2 diabetes mellitus with diabetic polyneuropathy: Secondary | ICD-10-CM

## 2024-02-29 DIAGNOSIS — R270 Ataxia, unspecified: Secondary | ICD-10-CM | POA: Diagnosis not present

## 2024-02-29 MED ORDER — CARBIDOPA-LEVODOPA 25-100 MG PO TABS: 1.0000 | ORAL_TABLET | Freq: Three times a day (TID) | ORAL | 1 refills | Status: AC

## 2024-02-29 NOTE — Patient Instructions (Signed)
 Start Carbidopa  Levodopa  as follows: Take 1/2 tablet three times daily, at least 30 minutes before meals (approximately 7am/11am/4pm), for one week Then take 1/2 tablet in the morning at 7am, 1/2 tablet in the afternoon at 11am, 1 tablet in the evening at 4pm, at least 30 minutes before meals, for one week Then take 1/2 tablet in the morning at 7am, 1 tablet in the afternoon at 11am, 1 tablet in the evening at 4pm, at least 30 minutes before meals, for one week Then take 1 tablet three times daily at 7am/11am/4pm, at least 30 minutes before meals   As a reminder, carbidopa /levodopa  can be taken at the same time as a carbohydrate, but we like to have you take your pill either 30 minutes before a protein source or 1 hour after as protein can interfere with carbidopa /levodopa  absorption.   A referral to [facility] has been placed for your MRI someone will contact you directly to schedule your appt. They are located at [address] [facility]. Please contact them directly by calling [phone] with any questions regarding your referral.   REGISTER NOW!  We are planning a Parkinsons Disease educational symposium at [facility], [address] [place], [place], [place] [address] on September 19.  We will have a movement disorder physician expert from [employer] coming to speak and a caregiver speaker.  We will have a panel of experts that will show you who you may need on your team of people on your journey with Parkinsons.  I hope to see you there!  Use this QR code to register by scanning it with the camera app on your phone:      Need more help with registration?  Contact [email or web address]

## 2024-03-01 ENCOUNTER — Other Ambulatory Visit (HOSPITAL_COMMUNITY): Payer: Self-pay | Admitting: Neurology

## 2024-03-01 DIAGNOSIS — R1310 Dysphagia, unspecified: Secondary | ICD-10-CM

## 2024-03-01 DIAGNOSIS — R059 Cough, unspecified: Secondary | ICD-10-CM

## 2024-03-02 DIAGNOSIS — R2689 Other abnormalities of gait and mobility: Secondary | ICD-10-CM | POA: Diagnosis not present

## 2024-03-02 DIAGNOSIS — M6281 Muscle weakness (generalized): Secondary | ICD-10-CM | POA: Diagnosis not present

## 2024-03-05 DIAGNOSIS — M6281 Muscle weakness (generalized): Secondary | ICD-10-CM | POA: Diagnosis not present

## 2024-03-05 DIAGNOSIS — R2689 Other abnormalities of gait and mobility: Secondary | ICD-10-CM | POA: Diagnosis not present

## 2024-03-09 DIAGNOSIS — M6281 Muscle weakness (generalized): Secondary | ICD-10-CM | POA: Diagnosis not present

## 2024-03-09 DIAGNOSIS — R2689 Other abnormalities of gait and mobility: Secondary | ICD-10-CM | POA: Diagnosis not present

## 2024-03-14 ENCOUNTER — Ambulatory Visit (HOSPITAL_COMMUNITY)
Admission: RE | Admit: 2024-03-14 | Discharge: 2024-03-14 | Disposition: A | Discharge status: Home / Self Care | Source: Ambulatory Visit | Attending: Family Medicine

## 2024-03-14 ENCOUNTER — Ambulatory Visit (HOSPITAL_COMMUNITY)
Admission: RE | Admit: 2024-03-14 | Discharge: 2024-03-14 | Disposition: A | Discharge status: Home / Self Care | Source: Ambulatory Visit | Attending: Family Medicine | Admitting: Family Medicine

## 2024-03-14 DIAGNOSIS — R1310 Dysphagia, unspecified: Secondary | ICD-10-CM | POA: Diagnosis not present

## 2024-03-14 DIAGNOSIS — R059 Cough, unspecified: Secondary | ICD-10-CM

## 2024-03-14 DIAGNOSIS — R1313 Dysphagia, pharyngeal phase: Secondary | ICD-10-CM | POA: Insufficient documentation

## 2024-03-14 DIAGNOSIS — R1319 Other dysphagia: Secondary | ICD-10-CM | POA: Insufficient documentation

## 2024-03-14 NOTE — Evaluation (Signed)
 Modified Barium Swallow Study  Patient Details  Name: Yesenia Meyers MRN: [id number] Date of Birth: [birth year]  Today's Date: 03/14/2024  Modified Barium Swallow completed.  Full report located under Chart Review in the Imaging Section.  History of Present Illness Yesenia Meyers is a 73 y.o. female referred for OPMBS per Dr. Asberry Schneider, [facility].  Pt with dx of suspected Parkinson's dz; hx vocal fold atrophy per ENT, hx speech evaluation at [facility]. Underwent transnasal  laryngostroboscopy 12/22/23 that found age-related vocal fold atrophy; pt has chronic throat-clearing. 01/17/23 esophagram: mild esophageal dysmotility; prominent osteophytes at C3-4.  Pt reports sensation of solid foods lodging in throat.   Clinical Impression Pt presents with a mild pharyngeal dysphagia. Efficency grade, which evaluates maximum percentage of pharyngeal residue, was a 1 (mild- less than half residue) and was related to vallecular residue and a 13 mm barium pill lodged in the valleculae. This appears to be due to inconsistent epiglottic closure over the larynx, inhibited by presence of cervical osteophyte at C3-4.  Liquid washes helped to clear residue and pill.  Safety grade, which is based on the Penetration-Aspiration scale, was a 0. There was no penetration nor aspiration.  Oral phase of the swallow was WNL.  Pt viewed the study in real time.  We discussed results and recommendations: continue a regular diet; use a liquid wash to clear residue; take pills crushed, halved and with pureed solids so that the weight of the thicker bolus will help with epiglottic inversion and pill transfer.  Ms. Spilman verbalized understanding.  DIGEST Swallow Severity Rating*  Safety: 0  Efficiency: 1  Overall Pharyngeal Swallow Severity: 1- mild  1: mild; 2: moderate; 3: severe; 4: profound  *The Dynamic Imaging Grade of Swallowing Toxicity is standardized for the head and neck cancer  population, however, demonstrates promising clinical applications across populations to standardize the clinical rating of pharyngeal swallow safety and severity.  Factors that may increase risk of adverse event in presence of aspiration Yesenia Meyers 2021):  none   Swallow Evaluation Recommendations Recommendations: PO diet PO Diet Recommendation: Regular;Thin liquids (Level 0) Liquid Administration via: Cup;Straw Medication Administration: Crushed with puree Supervision: Patient able to self-feed Swallowing strategies  : Follow solids with liquids Postural changes: Position pt fully upright for meals     Tenika Keeran L. Vona, MA CCC/SLP Clinical Specialist - Acute Care SLP Acute Rehabilitation Services Office number [phone]  Vona Palma Laurice 03/14/2024,2:10 PM

## 2024-03-16 DIAGNOSIS — R2689 Other abnormalities of gait and mobility: Secondary | ICD-10-CM | POA: Diagnosis not present

## 2024-03-16 DIAGNOSIS — M6281 Muscle weakness (generalized): Secondary | ICD-10-CM | POA: Diagnosis not present

## 2024-03-19 ENCOUNTER — Ambulatory Visit
Admission: RE | Admit: 2024-03-19 | Discharge: 2024-03-19 | Disposition: A | Discharge status: Home / Self Care | Source: Ambulatory Visit | Attending: Neurology | Admitting: Neurology

## 2024-03-19 DIAGNOSIS — G20C Parkinsonism, unspecified: Secondary | ICD-10-CM | POA: Diagnosis not present

## 2024-03-19 DIAGNOSIS — R270 Ataxia, unspecified: Secondary | ICD-10-CM

## 2024-03-23 DIAGNOSIS — M6281 Muscle weakness (generalized): Secondary | ICD-10-CM | POA: Diagnosis not present

## 2024-03-23 DIAGNOSIS — R2689 Other abnormalities of gait and mobility: Secondary | ICD-10-CM | POA: Diagnosis not present

## 2024-03-26 ENCOUNTER — Telehealth: Payer: Self-pay | Admitting: Neurology

## 2024-03-26 DIAGNOSIS — M6281 Muscle weakness (generalized): Secondary | ICD-10-CM | POA: Diagnosis not present

## 2024-03-26 DIAGNOSIS — R2689 Other abnormalities of gait and mobility: Secondary | ICD-10-CM | POA: Diagnosis not present

## 2024-03-26 NOTE — Telephone Encounter (Signed)
 Pt called about her results from her MRI- it doesn't look like they're released yet. She said she will call back in a few days if she has not heard from anyone

## 2024-03-28 ENCOUNTER — Ambulatory Visit: Payer: Self-pay | Admitting: Neurology

## 2024-03-30 DIAGNOSIS — M6281 Muscle weakness (generalized): Secondary | ICD-10-CM | POA: Diagnosis not present

## 2024-03-30 DIAGNOSIS — R2689 Other abnormalities of gait and mobility: Secondary | ICD-10-CM | POA: Diagnosis not present

## 2024-04-03 NOTE — Telephone Encounter (Signed)
 Called patient and let her know results. I also let her know we need her scheduled for 4-6 month follow up

## 2024-04-04 ENCOUNTER — Telehealth: Payer: Self-pay | Admitting: Neurology

## 2024-04-04 NOTE — Telephone Encounter (Signed)
 Pt lefted a message this morning, she would like for someone to call her about her MRI results. Thanks

## 2024-04-05 NOTE — Telephone Encounter (Signed)
 Patient has some more questions about the MRI please call

## 2024-04-05 NOTE — Telephone Encounter (Signed)
 Patient advised on  04/03/24  2:00 PM of results and also was informed she will need to schedule a 4-6 month follow up. See previous encounter.

## 2024-04-06 DIAGNOSIS — M6281 Muscle weakness (generalized): Secondary | ICD-10-CM | POA: Diagnosis not present

## 2024-04-06 DIAGNOSIS — R2689 Other abnormalities of gait and mobility: Secondary | ICD-10-CM | POA: Diagnosis not present

## 2024-04-09 DIAGNOSIS — R2689 Other abnormalities of gait and mobility: Secondary | ICD-10-CM | POA: Diagnosis not present

## 2024-04-09 DIAGNOSIS — M6281 Muscle weakness (generalized): Secondary | ICD-10-CM | POA: Diagnosis not present

## 2024-04-09 NOTE — Telephone Encounter (Signed)
 Called patient answered all questions about medications

## 2024-04-16 DIAGNOSIS — M6281 Muscle weakness (generalized): Secondary | ICD-10-CM | POA: Diagnosis not present

## 2024-04-16 DIAGNOSIS — R2689 Other abnormalities of gait and mobility: Secondary | ICD-10-CM | POA: Diagnosis not present

## 2024-04-20 DIAGNOSIS — M6281 Muscle weakness (generalized): Secondary | ICD-10-CM | POA: Diagnosis not present

## 2024-04-20 DIAGNOSIS — R2689 Other abnormalities of gait and mobility: Secondary | ICD-10-CM | POA: Diagnosis not present

## 2024-04-23 DIAGNOSIS — M6281 Muscle weakness (generalized): Secondary | ICD-10-CM | POA: Diagnosis not present

## 2024-04-23 DIAGNOSIS — R2689 Other abnormalities of gait and mobility: Secondary | ICD-10-CM | POA: Diagnosis not present

## 2024-04-30 DIAGNOSIS — M6281 Muscle weakness (generalized): Secondary | ICD-10-CM | POA: Diagnosis not present

## 2024-04-30 DIAGNOSIS — R2689 Other abnormalities of gait and mobility: Secondary | ICD-10-CM | POA: Diagnosis not present

## 2024-05-04 DIAGNOSIS — M6281 Muscle weakness (generalized): Secondary | ICD-10-CM | POA: Diagnosis not present

## 2024-05-04 DIAGNOSIS — R2689 Other abnormalities of gait and mobility: Secondary | ICD-10-CM | POA: Diagnosis not present

## 2024-05-18 DIAGNOSIS — R2689 Other abnormalities of gait and mobility: Secondary | ICD-10-CM | POA: Diagnosis not present

## 2024-05-18 DIAGNOSIS — M6281 Muscle weakness (generalized): Secondary | ICD-10-CM | POA: Diagnosis not present

## 2024-05-21 DIAGNOSIS — M6281 Muscle weakness (generalized): Secondary | ICD-10-CM | POA: Diagnosis not present

## 2024-05-21 DIAGNOSIS — R2689 Other abnormalities of gait and mobility: Secondary | ICD-10-CM | POA: Diagnosis not present

## 2024-05-25 DIAGNOSIS — M6281 Muscle weakness (generalized): Secondary | ICD-10-CM | POA: Diagnosis not present

## 2024-05-25 DIAGNOSIS — R2689 Other abnormalities of gait and mobility: Secondary | ICD-10-CM | POA: Diagnosis not present

## 2024-06-01 DIAGNOSIS — M6281 Muscle weakness (generalized): Secondary | ICD-10-CM | POA: Diagnosis not present

## 2024-06-01 DIAGNOSIS — R2689 Other abnormalities of gait and mobility: Secondary | ICD-10-CM | POA: Diagnosis not present

## 2024-06-01 DIAGNOSIS — E1122 Type 2 diabetes mellitus with diabetic chronic kidney disease: Secondary | ICD-10-CM | POA: Diagnosis not present

## 2024-06-01 DIAGNOSIS — R296 Repeated falls: Secondary | ICD-10-CM | POA: Diagnosis not present

## 2024-06-01 DIAGNOSIS — E1140 Type 2 diabetes mellitus with diabetic neuropathy, unspecified: Secondary | ICD-10-CM | POA: Diagnosis not present

## 2024-06-15 DIAGNOSIS — R2689 Other abnormalities of gait and mobility: Secondary | ICD-10-CM | POA: Diagnosis not present

## 2024-06-15 DIAGNOSIS — M6281 Muscle weakness (generalized): Secondary | ICD-10-CM | POA: Diagnosis not present

## 2024-06-18 DIAGNOSIS — M6281 Muscle weakness (generalized): Secondary | ICD-10-CM | POA: Diagnosis not present

## 2024-06-18 DIAGNOSIS — R2689 Other abnormalities of gait and mobility: Secondary | ICD-10-CM | POA: Diagnosis not present

## 2024-08-17 NOTE — Progress Notes (Unsigned)
 "   Assessment/Plan:  Assessment and Plan Assessment & Plan 1.  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  She is looking into possibly assisted living facilities.             - Patient is already involved in physical and speech therapy.  Discussed hypophonic speech and the role of Parkinson's disease plays into this.  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  - MRI brain nonacute     2.  Dysphagia             -mbe September, 2025 with evidence of mild pharyngeal dysphagia, which is in part due to presence of cervical osteophytes at C3-C4.  She has seen ENT for this as well.  Regular diet with thin liquids is recommended.   4.  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  In addition, treatment for instability due to diabetic peripheral neuropathy would be physical therapy.   5.  Hx diarrhea             - She asked if Parkinsons would cause diarrhea, and I told her that it would cause exactly the opposite, that being constipation.  I told her that it could be from the metformin , and she admitted that higher dosages of metformin  had caused diarrhea in the past.  She states that the diarrhea is now more intermittent, and often associated with eating poorly, including chocolate.    Subjective:   Discussed the use of AI scribe software for clinical note transcription with the patient, who gave verbal consent to proceed.  History of Present Illness     Yesenia Meyers was seen today in follow up for Parkinsons disease.  My previous records were reviewed prior to todays visit as well as outside records available to me.  Levodopa  was initiated last visit.  Patient is tolerating it well, without side effects.  MRI brain was completed as patient was ataxic last visit.   This was nonacute, demonstrating chronic white matter disease.  Patient with modified barium swallow September, 2025 with evidence of mild pharyngeal dysphagia  Current prescribed movement disorder medications: ***Carbidopa /levodopa  25/100, 1 tablet 3 times per day (started last visit)   PREVIOUS MEDICATIONS: {Parkinson's RX:18200}  ALLERGIES:  Allergies[1]  CURRENT MEDICATIONS: Active Medications[2]   Objective:   PHYSICAL EXAMINATION:    VITALS:  There were no vitals filed for this visit.  GEN:  The patient appears stated age and is in NAD. HEENT:  Normocephalic, atraumatic.  The mucous membranes are moist. The superficial temporal arteries are without ropiness or tenderness. CV:  RRR Lungs:  CTAB Neck/HEME:  There are no carotid bruits bilaterally.  Neurological examination:  Orientation: The patient is alert and oriented x3. Cranial nerves: There is good facial symmetry with*** facial hypomimia. The speech is fluent and clear. Soft palate rises symmetrically and there is no tongue deviation. Hearing is intact to conversational tone. Sensation: Sensation is intact to light touch throughout Motor: Strength is at least antigravity x4.  Movement examination: Tone: There is ***tone in the *** Abnormal movements: *** Coordination:  There  is *** decremation with RAM's, *** Gait and Station: The patient has *** difficulty arising out of a deep-seated chair without the use of the hands. The patient's stride length is ***.  The patient has a *** pull test.     I have reviewed and interpreted the following labs independently    Chemistry      Component Value Date/Time   NA 139 02/12/2024 1421   NA 142 01/06/2017 1611   K 3.8 02/12/2024 1421   K 3.7 01/06/2017 1611   CL 103 02/12/2024 1421   CO2 23 02/12/2024 1421   CO2 28 01/06/2017 1611   BUN 18 02/12/2024 1421   BUN 13.4 01/06/2017 1611   CREATININE 1.31 (H) 02/12/2024 1421   CREATININE 1.18 (H) 04/06/2022 1259    CREATININE 1.0 01/06/2017 1611      Component Value Date/Time   CALCIUM  8.7 (L) 02/12/2024 1421   CALCIUM  10.2 01/06/2017 1611   ALKPHOS 62 02/12/2024 1421   ALKPHOS 81 01/06/2017 1611   AST 23 02/12/2024 1421   AST 15 04/06/2022 1259   AST 27 01/06/2017 1611   ALT 20 02/12/2024 1421   ALT 13 04/06/2022 1259   ALT 31 01/06/2017 1611   BILITOT 0.5 02/12/2024 1421   BILITOT 0.6 04/06/2022 1259   BILITOT 0.88 01/06/2017 1611       Lab Results  Component Value Date   WBC 12.7 (H) 02/12/2024   HGB 13.1 02/12/2024   HCT 39.2 02/12/2024   MCV 89.5 02/12/2024   PLT 228 02/12/2024    Lab Results  Component Value Date   TSH 1.672 06/27/2021     Total time spent on today's visit was ***30 minutes, including both face-to-face time and nonface-to-face time.  Time included that spent on review of records (prior notes available to me/labs/imaging if pertinent), discussing treatment and goals, answering patient's questions and coordinating care.  Cc:  Dayna Motto, DO     [1]  Allergies Allergen Reactions   Celebrex [Celecoxib] Rash   Feldene [Piroxicam] Swelling   Voltaren [Diclofenac Sodium] Rash  [2]  No outpatient medications have been marked as taking for the 08/21/24 encounter (Appointment) with Krissy Orebaugh, Asberry RAMAN, DO.   "

## 2024-08-21 ENCOUNTER — Ambulatory Visit: Admitting: Neurology
